# Patient Record
Sex: Female | Born: 1955
Health system: Southern US, Community
[De-identification: ages and names within clinical notes are randomized; demographics above are authoritative.]

## PROBLEM LIST (undated history)

## (undated) DIAGNOSIS — N289 Disorder of kidney and ureter, unspecified: Secondary | ICD-10-CM

## (undated) DIAGNOSIS — K219 Gastro-esophageal reflux disease without esophagitis: Secondary | ICD-10-CM

## (undated) DIAGNOSIS — T7840XA Allergy, unspecified, initial encounter: Secondary | ICD-10-CM

## (undated) DIAGNOSIS — M1990 Unspecified osteoarthritis, unspecified site: Secondary | ICD-10-CM

## (undated) DIAGNOSIS — H409 Unspecified glaucoma: Secondary | ICD-10-CM

## (undated) DIAGNOSIS — A419 Sepsis, unspecified organism: Secondary | ICD-10-CM

## (undated) HISTORY — PX: DILATION AND CURETTAGE, DIAGNOSTIC / THERAPEUTIC: SUR384

## (undated) HISTORY — DX: Allergy, unspecified, initial encounter: T78.40XA

## (undated) HISTORY — PX: EYE SURGERY: SHX253

## (undated) HISTORY — DX: Gastro-esophageal reflux disease without esophagitis: K21.9

## (undated) HISTORY — DX: Unspecified osteoarthritis, unspecified site: M19.90

## (undated) HISTORY — DX: Unspecified glaucoma: H40.9

---

## 2002-07-07 ENCOUNTER — Other Ambulatory Visit: Admission: RE | Admit: 2002-07-07 | Discharge: 2002-07-07 | Payer: Self-pay | Admitting: Obstetrics and Gynecology

## 2016-03-06 ENCOUNTER — Ambulatory Visit (INDEPENDENT_AMBULATORY_CARE_PROVIDER_SITE_OTHER): Payer: PRIVATE HEALTH INSURANCE | Admitting: Physician Assistant

## 2016-03-06 ENCOUNTER — Ambulatory Visit: Payer: PRIVATE HEALTH INSURANCE

## 2016-03-06 DIAGNOSIS — S61031A Puncture wound without foreign body of right thumb without damage to nail, initial encounter: Secondary | ICD-10-CM

## 2016-03-06 DIAGNOSIS — Z23 Encounter for immunization: Secondary | ICD-10-CM

## 2016-03-06 DIAGNOSIS — M79644 Pain in right finger(s): Secondary | ICD-10-CM

## 2016-03-06 MED ORDER — CEPHALEXIN 500 MG PO CAPS: 500.0000 mg | ORAL_CAPSULE | Freq: Three times a day (TID) | ORAL | Status: DC

## 2016-03-06 NOTE — Patient Instructions (Addendum)
Use warm compress and warm soaks when possible.  Take all of the antibiotic course.  Come back if you are worse, or the problem goes on for more than 72 hours from now.     IF you received an x-ray today, you will receive an invoice from [facility]. Please contact [facility] at [phone] with questions or concerns regarding your invoice.   IF you received labwork today, you will receive an invoice from [employer]. Please contact [facility] at [phone] with questions or concerns regarding your invoice.   Our billing staff will not be able to assist you with questions regarding bills from these companies.  You will be contacted with the lab results as soon as they are available. The fastest way to get your results is to activate your My Chart account. Instructions are located on the last page of this paperwork. If you have not heard from [place] regarding the results in 2 weeks, please contact this office.

## 2016-03-06 NOTE — Progress Notes (Signed)
   03/06/2016 5:05 PM   DOB: 1956-03-13 / MRN: [id number]  SUBJECTIVE:  Veronica Woods is a 60 y.o. female presenting for a penetrating injury to the right thumb.  States she was coming down the ladder from the attic and suddenly felt pain under the right thumb nail. She assumes there is a splinter under the nail.  The husband has tried to remove the splinter and this was very painful for her.  She report the finger feels warm to the touch.   She is allergic to sulfur.   She  has no past medical history on file.    She  reports that she has never smoked. She does not have any smokeless tobacco history on file. She reports that she does not drink alcohol or use illicit drugs. She  has no sexual activity history on file. The patient  has no past surgical history on file.  Her family history includes Cancer in her father and mother; Heart disease in her mother; Hyperlipidemia in her mother; Hypertension in her mother; Stroke in her mother.  Review of Systems  Skin: Negative for rash.  Neurological: Negative for dizziness.    Problem list and medications reviewed and updated by myself where necessary, and exist elsewhere in the encounter.   OBJECTIVE:  BP 118/80 mmHg  Pulse 98  Temp(Src) 98.2 F (36.8 C) (Oral)  Resp 18  Ht 5\' 4"  (1.626 m)  Wt 152 lb 6.4 oz (69.128 kg)  BMI 26.15 kg/m2  SpO2 96%  Physical Exam  Constitutional: She is oriented to person, place, and time. She appears well-nourished. No distress.  Eyes: EOM are normal. Pupils are equal, round, and reactive to light.  Cardiovascular: Normal rate.   Pulmonary/Chest: Effort normal.  Abdominal: She exhibits no distension.  Musculoskeletal:       Hands: Neurological: She is alert and oriented to person, place, and time. No cranial nerve deficit. Gait normal.  Skin: Skin is dry. She is not diaphoretic.  Psychiatric: She has a normal mood and affect.  Vitals reviewed.   No results found for this or any previous  visit.  No results found.  ASSESSMENT AND PLAN  Brittanny was seen today for foreign body in skin.  Diagnoses and all orders for this visit:  Thumb pain, right: Most likely secondary to trauma to the nail bed.  I do not think there is a splinter in her finger and worry that probing the finger with splinter forceps would do more harm than good at this point. Will prevent a cellulitis with Keflex 500 tid.  -     Cancel: DG Finger Thumb Right; Future  Need for TD vaccine -     Td vaccine greater than or equal to 7yo preservative free IM  Other orders -     cephALEXin (KEFLEX) 500 MG capsule; Take 1 capsule (500 mg total) by mouth 3 (three) times daily.    The patient was advised to call or return to clinic if she does not see an improvement in symptoms, or to seek the care of the closest emergency department if she worsens with the above plan.   Philis Fendt, MHS, PA-C [facility] 03/06/2016 5:05 PM

## 2016-08-31 DIAGNOSIS — I4819 Other persistent atrial fibrillation: Secondary | ICD-10-CM

## 2016-08-31 HISTORY — DX: Other persistent atrial fibrillation: I48.19

## 2017-05-11 ENCOUNTER — Ambulatory Visit (INDEPENDENT_AMBULATORY_CARE_PROVIDER_SITE_OTHER): Payer: PRIVATE HEALTH INSURANCE | Admitting: Emergency Medicine

## 2017-05-11 ENCOUNTER — Encounter: Payer: Self-pay | Admitting: Emergency Medicine

## 2017-05-11 VITALS — BP 142/90 | HR 108 | Temp 99.3°F | Resp 17 | Ht 64.5 in | Wt 152.0 lb

## 2017-05-11 DIAGNOSIS — R399 Unspecified symptoms and signs involving the genitourinary system: Secondary | ICD-10-CM | POA: Diagnosis not present

## 2017-05-11 DIAGNOSIS — R300 Dysuria: Secondary | ICD-10-CM | POA: Insufficient documentation

## 2017-05-11 DIAGNOSIS — B999 Unspecified infectious disease: Secondary | ICD-10-CM | POA: Insufficient documentation

## 2017-05-11 DIAGNOSIS — R102 Pelvic and perineal pain: Secondary | ICD-10-CM | POA: Insufficient documentation

## 2017-05-11 LAB — POC MICROSCOPIC URINALYSIS (UMFC): Mucus: ABSENT

## 2017-05-11 LAB — POCT URINALYSIS DIP (MANUAL ENTRY)
BILIRUBIN UA: NEGATIVE
Glucose, UA: NEGATIVE mg/dL
Ketones, POC UA: NEGATIVE mg/dL
Leukocytes, UA: NEGATIVE
NITRITE UA: NEGATIVE
Protein Ur, POC: NEGATIVE mg/dL
RBC UA: NEGATIVE
Spec Grav, UA: 1.01 (ref 1.010–1.025)
Urobilinogen, UA: 0.2 E.U./dL
pH, UA: 5.5 (ref 5.0–8.0)

## 2017-05-11 MED ORDER — CIPROFLOXACIN HCL 500 MG PO TABS: 500.0000 mg | ORAL_TABLET | Freq: Two times a day (BID) | ORAL | 0 refills | Status: AC

## 2017-05-11 NOTE — Patient Instructions (Addendum)
   IF you received an x-ray today, you will receive an invoice from [facility]. Please contact [facility] at [phone] with questions or concerns regarding your invoice.   IF you received labwork today, you will receive an invoice from [facility]. Please contact [facility] at [phone] with questions or concerns regarding your invoice.   Our billing staff will not be able to assist you with questions regarding bills from these companies.  You will be contacted with the lab results as soon as they are available. The fastest way to get your results is to activate your My Chart account. Instructions are located on the last page of this paperwork. If you have not heard from us regarding the results in 2 weeks, please contact this office.    Dysuria Dysuria is pain or discomfort while urinating. The pain or discomfort may be felt in the tube that carries urine out of the bladder (urethra) or in the surrounding tissue of the genitals. The pain may also be felt in the groin area, lower abdomen, and lower back. You may have to urinate frequently or have the sudden feeling that you have to urinate (urgency). Dysuria can affect both men and women, but is more common in women. Dysuria can be caused by many different things, including:  Urinary tract infection in women.  Infection of the kidney or bladder.  Kidney stones or bladder stones.  Certain sexually transmitted infections (STIs), such as chlamydia.  Dehydration.  Inflammation of the vagina.  Use of certain medicines.  Use of certain soaps or scented products that cause irritation.  Follow these instructions at home: Watch your dysuria for any changes. The following actions may help to reduce any discomfort you are feeling:  Drink enough fluid to keep your urine clear or pale yellow.  Empty your bladder often. Avoid holding urine for long periods of time.  After a bowel movement or urination, women should  cleanse from front to back, using each tissue only once.  Empty your bladder after sexual intercourse.  Take medicines only as directed by your health care provider.  If you were prescribed an antibiotic medicine, finish it all even if you start to feel better.  Avoid caffeine, tea, and alcohol. They can irritate the bladder and make dysuria worse. In men, alcohol may irritate the prostate.  Keep all follow-up visits as directed by your health care provider. This is important.  If you had any tests done to find the cause of dysuria, it is your responsibility to obtain your test results. Ask the lab or department performing the test when and how you will get your results. Talk with your health care provider if you have any questions about your results.  Contact a health care provider if:  You develop pain in your back or sides.  You have a fever.  You have nausea or vomiting.  You have blood in your urine.  You are not urinating as often as you usually do. Get help right away if:  You pain is severe and not relieved with medicines.  You are unable to hold down any fluids.  You or someone else notices a change in your mental function.  You have a rapid heartbeat at rest.  You have shaking or chills.  You feel extremely weak. This information is not intended to replace advice given to you by your health care provider. Make sure you discuss any questions you have with your health care provider. Document Released: 05/15/2004 Document   Revised: 01/23/2016 Document Reviewed: 04/12/2014 Elsevier Interactive Patient Education  2018 Elsevier Inc.  

## 2017-05-11 NOTE — Progress Notes (Signed)
Veronica Woods 61 y.o.   Chief Complaint  Patient presents with  . Dysuria    HISTORY OF PRESENT ILLNESS: This is a 61 y.o. female complaining of dark urine last week followed by urinary/pelvic discomfort and blood in the urine; denies flank pain but had some lumbar aching; denies fever, chills, nausea, and vomiting. Also noted some swelling to right foot and bilateral weakness to lower legs but that is now better/gone. Healthy woman with healthy lifestyle.  HPI   Prior to Admission medications   Not on File    Allergies  Allergen Reactions  . Sulfur Anaphylaxis    Childhood reaction    There are no active problems to display for this patient.   No past medical history on file.  No past surgical history on file.  Social History   Social History  . Marital status: Married    Spouse name: N/A  . Number of children: N/A  . Years of education: N/A   Occupational History  . Not on file.   Social History Main Topics  . Smoking status: Never Smoker  . Smokeless tobacco: Never Used  . Alcohol use No  . Drug use: No  . Sexual activity: Not on file   Other Topics Concern  . Not on file   Social History Narrative  . No narrative on file    Family History  Problem Relation Age of Onset  . Cancer Mother   . Heart disease Mother   . Hyperlipidemia Mother   . Hypertension Mother   . Stroke Mother   . Cancer Father      Review of Systems  Constitutional: Negative.  Negative for chills, fever and malaise/fatigue.  HENT: Negative.   Eyes: Negative.   Respiratory: Negative for cough and shortness of breath.   Cardiovascular: Positive for leg swelling. Negative for chest pain and palpitations.  Gastrointestinal: Negative for abdominal pain, blood in stool, diarrhea, melena, nausea and vomiting.  Genitourinary: Positive for dysuria, frequency and hematuria.  Musculoskeletal: Positive for back pain.  Skin: Negative.  Negative for rash.  Neurological: Negative for  dizziness, sensory change, focal weakness and headaches.  Endo/Heme/Allergies: Negative.   All other systems reviewed and are negative.  Vitals:   05/11/17 1114  BP: (!) 142/90  Pulse: (!) 108  Resp: 17  Temp: 99.3 F (37.4 C)  SpO2: 98%     Physical Exam  Constitutional: She is oriented to person, place, and time. She appears well-developed and well-nourished.  HENT:  Head: Normocephalic and atraumatic.  Nose: Nose normal.  Mouth/Throat: Oropharynx is clear and moist.  Eyes: Pupils are equal, round, and reactive to light. Conjunctivae and EOM are normal.  Neck: Normal range of motion. Neck supple. No JVD present. No thyromegaly present.  Cardiovascular: Normal rate, regular rhythm and normal heart sounds.   Pulmonary/Chest: Effort normal and breath sounds normal.  Abdominal: Soft. Bowel sounds are normal. She exhibits no distension and no mass. There is no tenderness. There is no rebound and no guarding.  Musculoskeletal: Normal range of motion. She exhibits no edema, tenderness or deformity.       Lumbar back: Normal.  Lymphadenopathy:    She has no cervical adenopathy.  Neurological: She is alert and oriented to person, place, and time. No sensory deficit. She exhibits normal muscle tone.  Skin: Skin is warm and dry. Capillary refill takes less than 2 seconds.  Psychiatric: She has a normal mood and affect. Her behavior is normal.  Vitals  reviewed.  Results for orders placed or performed in visit on 05/11/17 (from the past 24 hour(s))  POCT urinalysis dipstick     Status: None   Collection Time: 05/11/17 11:28 AM  Result Value Ref Range   Color, UA yellow yellow   Clarity, UA clear clear   Glucose, UA negative negative mg/dL   Bilirubin, UA negative negative   Ketones, POC UA negative negative mg/dL   Spec Grav, UA 1.010 1.010 - 1.025   Blood, UA negative negative   pH, UA 5.5 5.0 - 8.0   Protein Ur, POC negative negative mg/dL   Urobilinogen, UA 0.2 0.2 or 1.0  E.U./dL   Nitrite, UA Negative Negative   Leukocytes, UA Negative Negative  POCT Microscopic Urinalysis ([facility])     Status: Abnormal   Collection Time: 05/11/17 11:41 AM  Result Value Ref Range   WBC,UR,HPF,POC None None WBC/hpf   RBC,UR,HPF,POC None None RBC/hpf   Bacteria None None, Too numerous to count   Mucus Absent Absent   Epithelial Cells, UR Per Microscopy Few (A) None, Too numerous to count cells/hpf     ASSESSMENT & PLAN: Veronica Woods was seen today for dysuria.  Diagnoses and all orders for this visit:  Dysuria -     POCT urinalysis dipstick -     POCT Microscopic Urinalysis ([facility]) -     Urine Culture  Lower urinary tract symptoms (LUTS) -     CBC with Differential/Platelet -     Comprehensive metabolic panel  Clinical infection  Pelvic pain  Other orders -     ciprofloxacin (CIPRO) 500 MG tablet; Take 1 tablet (500 mg total) by mouth 2 (two) times daily.   Patient Instructions       IF you received an x-ray today, you will receive an invoice from [facility]. Please contact [facility] at [phone] with questions or concerns regarding your invoice.   IF you received labwork today, you will receive an invoice from [facility]. Please contact [facility] at [phone] with questions or concerns regarding your invoice.   Our billing staff will not be able to assist you with questions regarding bills from these companies.  You will be contacted with the lab results as soon as they are available. The fastest way to get your results is to activate your My Chart account. Instructions are located on the last page of this paperwork. If you have not heard from [place] regarding the results in 2 weeks, please contact this office.     Dysuria Dysuria is pain or discomfort while urinating. The pain or discomfort may be felt in the tube that carries urine out of the bladder (urethra) or in the surrounding tissue of the genitals. The pain may also be felt in the  groin area, lower abdomen, and lower back. You may have to urinate frequently or have the sudden feeling that you have to urinate (urgency). Dysuria can affect both men and women, but is more common in women. Dysuria can be caused by many different things, including:  Urinary tract infection in women.  Infection of the kidney or bladder.  Kidney stones or bladder stones.  Certain sexually transmitted infections (STIs), such as chlamydia.  Dehydration.  Inflammation of the vagina.  Use of certain medicines.  Use of certain soaps or scented products that cause irritation.  Follow these instructions at home: Watch your dysuria for any changes. The following actions may help to reduce any discomfort you are feeling:  Drink enough fluid to  keep your urine clear or pale yellow.  Empty your bladder often. Avoid holding urine for long periods of time.  After a bowel movement or urination, women should cleanse from front to back, using each tissue only once.  Empty your bladder after sexual intercourse.  Take medicines only as directed by your health care provider.  If you were prescribed an antibiotic medicine, finish it all even if you start to feel better.  Avoid caffeine, tea, and alcohol. They can irritate the bladder and make dysuria worse. In men, alcohol may irritate the prostate.  Keep all follow-up visits as directed by your health care provider. This is important.  If you had any tests done to find the cause of dysuria, it is your responsibility to obtain your test results. Ask the lab or department performing the test when and how you will get your results. Talk with your health care provider if you have any questions about your results.  Contact a health care provider if:  You develop pain in your back or sides.  You have a fever.  You have nausea or vomiting.  You have blood in your urine.  You are not urinating as often as you usually do. Get help right away  if:  You pain is severe and not relieved with medicines.  You are unable to hold down any fluids.  You or someone else notices a change in your mental function.  You have a rapid heartbeat at rest.  You have shaking or chills.  You feel extremely weak. This information is not intended to replace advice given to you by your health care provider. Make sure you discuss any questions you have with your health care provider. Document Released: 05/15/2004 Document Revised: 01/23/2016 Document Reviewed: 04/12/2014 Elsevier Interactive Patient Education  2018 Elsevier Inc.      Agustina Caroli, MD [facility]

## 2017-05-12 ENCOUNTER — Encounter: Payer: Self-pay | Admitting: Radiology

## 2017-05-12 LAB — CBC WITH DIFFERENTIAL/PLATELET
Basophils Absolute: 0 10*3/uL (ref 0.0–0.2)
Basos: 1 %
EOS (ABSOLUTE): 0 10*3/uL (ref 0.0–0.4)
Eos: 1 %
HEMATOCRIT: 41.7 % (ref 34.0–46.6)
HEMOGLOBIN: 14.1 g/dL (ref 11.1–15.9)
IMMATURE GRANS (ABS): 0 10*3/uL (ref 0.0–0.1)
IMMATURE GRANULOCYTES: 0 %
LYMPHS ABS: 0.8 10*3/uL (ref 0.7–3.1)
Lymphs: 19 %
MCH: 30.7 pg (ref 26.6–33.0)
MCHC: 33.8 g/dL (ref 31.5–35.7)
MCV: 91 fL (ref 79–97)
Monocytes Absolute: 0.4 10*3/uL (ref 0.1–0.9)
Monocytes: 8 %
Neutrophils Absolute: 3.1 10*3/uL (ref 1.4–7.0)
Neutrophils: 71 %
Platelets: 279 10*3/uL (ref 150–379)
RBC: 4.6 x10E6/uL (ref 3.77–5.28)
RDW: 14.4 % (ref 12.3–15.4)
WBC: 4.3 10*3/uL (ref 3.4–10.8)

## 2017-05-12 LAB — COMPREHENSIVE METABOLIC PANEL
A/G RATIO: 1.8 (ref 1.2–2.2)
ALT: 22 IU/L (ref 0–32)
AST: 26 IU/L (ref 0–40)
Albumin: 4.6 g/dL (ref 3.6–4.8)
Alkaline Phosphatase: 88 IU/L (ref 39–117)
BUN/Creatinine Ratio: 24 (ref 12–28)
BUN: 14 mg/dL (ref 8–27)
Bilirubin Total: 0.5 mg/dL (ref 0.0–1.2)
CALCIUM: 9.6 mg/dL (ref 8.7–10.3)
CO2: 24 mmol/L (ref 20–29)
Chloride: 101 mmol/L (ref 96–106)
Creatinine, Ser: 0.59 mg/dL (ref 0.57–1.00)
GFR, EST AFRICAN AMERICAN: 114 mL/min/{1.73_m2} (ref >59)
GFR, EST NON AFRICAN AMERICAN: 99 mL/min/{1.73_m2} (ref >59)
GLOBULIN, TOTAL: 2.6 g/dL (ref 1.5–4.5)
Glucose: 84 mg/dL (ref 65–99)
POTASSIUM: 4.7 mmol/L (ref 3.5–5.2)
SODIUM: 139 mmol/L (ref 134–144)
Total Protein: 7.2 g/dL (ref 6.0–8.5)

## 2017-05-13 LAB — URINE CULTURE

## 2017-05-24 ENCOUNTER — Telehealth: Payer: Self-pay | Admitting: Emergency Medicine

## 2017-05-24 NOTE — Telephone Encounter (Signed)
Patient will call and get an appointment with Dr. Rosezella Rumpf tomorrow for a recheck of her symptoms

## 2017-05-24 NOTE — Telephone Encounter (Signed)
Pt is stating that she saw sagardia recently for a uti and has completed the meds on 05/18/17 and the symptoms have returned and would like to know what to do next  Best number [phone]

## 2017-05-25 ENCOUNTER — Ambulatory Visit (INDEPENDENT_AMBULATORY_CARE_PROVIDER_SITE_OTHER): Payer: PRIVATE HEALTH INSURANCE | Admitting: Emergency Medicine

## 2017-05-25 ENCOUNTER — Encounter: Payer: Self-pay | Admitting: Emergency Medicine

## 2017-05-25 VITALS — BP 122/68 | HR 107 | Temp 99.1°F | Resp 16 | Ht 64.0 in | Wt 151.2 lb

## 2017-05-25 DIAGNOSIS — R319 Hematuria, unspecified: Secondary | ICD-10-CM | POA: Insufficient documentation

## 2017-05-25 DIAGNOSIS — N390 Urinary tract infection, site not specified: Secondary | ICD-10-CM | POA: Insufficient documentation

## 2017-05-25 DIAGNOSIS — R399 Unspecified symptoms and signs involving the genitourinary system: Secondary | ICD-10-CM

## 2017-05-25 LAB — POCT URINALYSIS DIP (MANUAL ENTRY)
BILIRUBIN UA: NEGATIVE
GLUCOSE UA: NEGATIVE mg/dL
Ketones, POC UA: NEGATIVE mg/dL
Leukocytes, UA: NEGATIVE
NITRITE UA: NEGATIVE
PH UA: 6.5 (ref 5.0–8.0)
Protein Ur, POC: NEGATIVE mg/dL
SPEC GRAV UA: 1.01 (ref 1.010–1.025)
Urobilinogen, UA: 0.2 E.U./dL

## 2017-05-25 MED ORDER — CEFADROXIL 500 MG PO CAPS: 500.0000 mg | ORAL_CAPSULE | Freq: Two times a day (BID) | ORAL | 0 refills | Status: AC

## 2017-05-25 NOTE — Patient Instructions (Addendum)
     IF you received an x-ray today, you will receive an invoice from [facility]. Please contact [facility] at [phone] with questions or concerns regarding your invoice.   IF you received labwork today, you will receive an invoice from [facility]. Please contact [facility] at [phone] with questions or concerns regarding your invoice.   Our billing staff will not be able to assist you with questions regarding bills from these companies.  You will be contacted with the lab results as soon as they are available. The fastest way to get your results is to activate your My Chart account. Instructions are located on the last page of this paperwork. If you have not heard from us regarding the results in 2 weeks, please contact this office.     Urinary Tract Infection, Adult A urinary tract infection (UTI) is an infection of any part of the urinary tract. The urinary tract includes the:  Kidneys.  Ureters.  Bladder.  Urethra.  These organs make, store, and get rid of pee (urine) in the body. Follow these instructions at home:  Take over-the-counter and prescription medicines only as told by your doctor.  If you were prescribed an antibiotic medicine, take it as told by your doctor. Do not stop taking the antibiotic even if you start to feel better.  Avoid the following drinks: ? Alcohol. ? Caffeine. ? Tea. ? Carbonated drinks.  Drink enough fluid to keep your pee clear or pale yellow.  Keep all follow-up visits as told by your doctor. This is important.  Make sure to: ? Empty your bladder often and completely. Do not to hold pee for long periods of time. ? Empty your bladder before and after sex. ? Wipe from front to back after a bowel movement if you are female. Use each tissue one time when you wipe. Contact a doctor if:  You have back pain.  You have a fever.  You feel sick to your stomach (nauseous).  You throw up (vomit).  Your symptoms do  not get better after 3 days.  Your symptoms go away and then come back. Get help right away if:  You have very bad back pain.  You have very bad lower belly (abdominal) pain.  You are throwing up and cannot keep down any medicines or water. This information is not intended to replace advice given to you by your health care provider. Make sure you discuss any questions you have with your health care provider. Document Released: 02/03/2008 Document Revised: 01/23/2016 Document Reviewed: 07/08/2015 Elsevier Interactive Patient Education  2018 Elsevier Inc.  

## 2017-05-25 NOTE — Progress Notes (Signed)
Veronica Woods 61 y.o.   Chief Complaint  Patient presents with  . Urinary Tract Infection    per patient recurrent with blood in urine and left side pain    HISTORY OF PRESENT ILLNESS: This is a 61 y.o. female complaining of UTI symptoms; seen by me 9/11 and started on Cipro; did well bu 2 days after stopping Cipro symptoms came back. Urine culture grew Streptococcus Group B.  Urinary Tract Infection   This is a new problem. The current episode started in the past 7 days. The problem occurs every urination. The problem has been gradually worsening. The quality of the pain is described as burning. The pain is at a severity of 3/10. The pain is mild. The maximum temperature recorded prior to her arrival was 100 - 100.9 F. The fever has been present for 1 - 2 days. There is no history of pyelonephritis. Associated symptoms include flank pain, frequency, hematuria and urgency. Pertinent negatives include no chills, discharge, nausea or vomiting. She has tried antibiotics (recently on Cipro) for the symptoms. There is no history of catheterization, kidney stones or a urological procedure.     Prior to Admission medications   Not on File    Allergies  Allergen Reactions  . Sulfur Anaphylaxis    Childhood reaction    Patient Active Problem List   Diagnosis Date Noted  . Pelvic pain 05/11/2017  . Clinical infection 05/11/2017  . Lower urinary tract symptoms (LUTS) 05/11/2017  . Dysuria 05/11/2017    No past medical history on file.  No past surgical history on file.  Social History   Social History  . Marital status: Married    Spouse name: N/A  . Number of children: N/A  . Years of education: N/A   Occupational History  . Not on file.   Social History Main Topics  . Smoking status: Never Smoker  . Smokeless tobacco: Never Used  . Alcohol use No  . Drug use: No  . Sexual activity: Not on file   Other Topics Concern  . Not on file   Social History Narrative  . No  narrative on file    Family History  Problem Relation Age of Onset  . Cancer Mother   . Heart disease Mother   . Hyperlipidemia Mother   . Hypertension Mother   . Stroke Mother   . Cancer Father      Review of Systems  Constitutional: Positive for fever. Negative for chills.  HENT: Negative.  Negative for sore throat.   Eyes: Negative.  Negative for discharge and redness.  Respiratory: Negative.  Negative for cough and shortness of breath.   Cardiovascular: Negative.  Negative for chest pain, palpitations and leg swelling.  Gastrointestinal: Negative for abdominal pain, diarrhea, nausea and vomiting.  Genitourinary: Positive for dysuria, flank pain, frequency, hematuria and urgency.  Musculoskeletal: Negative for joint pain, myalgias and neck pain.  Skin: Negative.  Negative for rash.  Neurological: Negative.  Negative for dizziness and headaches.  Endo/Heme/Allergies: Negative.   All other systems reviewed and are negative.  Vitals:   05/25/17 0953  BP: 122/68  Pulse: (!) 107  Resp: 16  Temp: 99.1 F (37.3 C)  SpO2: 98%   Results for orders placed or performed in visit on 05/25/17 (from the past 24 hour(s))  POCT urinalysis dipstick     Status: Abnormal   Collection Time: 05/25/17 10:35 AM  Result Value Ref Range   Color, UA yellow yellow  Clarity, UA clear clear   Glucose, UA negative negative mg/dL   Bilirubin, UA negative negative   Ketones, POC UA negative negative mg/dL   Spec Grav, UA 1.010 1.010 - 1.025   Blood, UA small (A) negative   pH, UA 6.5 5.0 - 8.0   Protein Ur, POC negative negative mg/dL   Urobilinogen, UA 0.2 0.2 or 1.0 E.U./dL   Nitrite, UA Negative Negative   Leukocytes, UA Negative Negative     Physical Exam  Constitutional: She is oriented to person, place, and time. She appears well-developed and well-nourished.  HENT:  Head: Normocephalic and atraumatic.  Eyes: Pupils are equal, round, and reactive to light. Conjunctivae and EOM  are normal.  Neck: Normal range of motion. Neck supple.  Cardiovascular: Normal rate, regular rhythm, normal heart sounds and intact distal pulses.   Pulmonary/Chest: Effort normal and breath sounds normal.  Abdominal: Soft. Bowel sounds are normal. She exhibits no distension. There is no tenderness.  Musculoskeletal: Normal range of motion.  Neurological: She is alert and oriented to person, place, and time. No sensory deficit. She exhibits normal muscle tone.  Skin: Skin is warm and dry. Capillary refill takes less than 2 seconds. No rash noted.  Psychiatric: She has a normal mood and affect. Her behavior is normal.  Vitals reviewed.    ASSESSMENT & PLAN: Taffie was seen today for urinary tract infection.  Diagnoses and all orders for this visit:  Lower urinary tract symptoms (LUTS) -     Urine Culture -     POCT urinalysis dipstick  Hematuria, unspecified type  Acute UTI  Other orders -     cefadroxil (DURICEF) 500 MG capsule; Take 1 capsule (500 mg total) by mouth 2 (two) times daily.    Patient Instructions       IF you received an x-ray today, you will receive an invoice from [facility]. Please contact [facility] at [phone] with questions or concerns regarding your invoice.   IF you received labwork today, you will receive an invoice from [facility]. Please contact [facility] at [phone] with questions or concerns regarding your invoice.   Our billing staff will not be able to assist you with questions regarding bills from these companies.  You will be contacted with the lab results as soon as they are available. The fastest way to get your results is to activate your My Chart account. Instructions are located on the last page of this paperwork. If you have not heard from [place] regarding the results in 2 weeks, please contact this office.     Urinary Tract Infection, Adult A urinary tract infection (UTI) is an infection of any part of the  urinary tract. The urinary tract includes the:  Kidneys.  Ureters.  Bladder.  Urethra.  These organs make, store, and get rid of pee (urine) in the body. Follow these instructions at home:  Take over-the-counter and prescription medicines only as told by your doctor.  If you were prescribed an antibiotic medicine, take it as told by your doctor. Do not stop taking the antibiotic even if you start to feel better.  Avoid the following drinks: ? Alcohol. ? Caffeine. ? Tea. ? Carbonated drinks.  Drink enough fluid to keep your pee clear or pale yellow.  Keep all follow-up visits as told by your doctor. This is important.  Make sure to: ? Empty your bladder often and completely. Do not to hold pee for long periods of time. ? Empty your bladder  before and after sex. ? Wipe from front to back after a bowel movement if you are female. Use each tissue one time when you wipe. Contact a doctor if:  You have back pain.  You have a fever.  You feel sick to your stomach (nauseous).  You throw up (vomit).  Your symptoms do not get better after 3 days.  Your symptoms go away and then come back. Get help right away if:  You have very bad back pain.  You have very bad lower belly (abdominal) pain.  You are throwing up and cannot keep down any medicines or water. This information is not intended to replace advice given to you by your health care provider. Make sure you discuss any questions you have with your health care provider. Document Released: 02/03/2008 Document Revised: 01/23/2016 Document Reviewed: 07/08/2015 Elsevier Interactive Patient Education  2018 Elsevier Inc.      Agustina Caroli, MD [facility]

## 2017-05-27 LAB — URINE CULTURE

## 2017-05-31 NOTE — Telephone Encounter (Signed)
Informed patient of lab results

## 2017-06-04 ENCOUNTER — Ambulatory Visit (HOSPITAL_COMMUNITY)
Admission: RE | Admit: 2017-06-04 | Discharge: 2017-06-04 | Disposition: A | Discharge status: Home / Self Care | Payer: BC Managed Care – PPO | Source: Ambulatory Visit | Attending: Emergency Medicine | Admitting: Emergency Medicine

## 2017-06-04 ENCOUNTER — Ambulatory Visit (INDEPENDENT_AMBULATORY_CARE_PROVIDER_SITE_OTHER): Payer: PRIVATE HEALTH INSURANCE | Admitting: Emergency Medicine

## 2017-06-04 ENCOUNTER — Encounter: Payer: Self-pay | Admitting: Emergency Medicine

## 2017-06-04 VITALS — BP 104/66 | HR 115 | Temp 98.6°F | Resp 16 | Ht 64.0 in | Wt 151.4 lb

## 2017-06-04 DIAGNOSIS — R399 Unspecified symptoms and signs involving the genitourinary system: Secondary | ICD-10-CM | POA: Diagnosis not present

## 2017-06-04 DIAGNOSIS — R1032 Left lower quadrant pain: Secondary | ICD-10-CM

## 2017-06-04 DIAGNOSIS — R319 Hematuria, unspecified: Secondary | ICD-10-CM | POA: Diagnosis not present

## 2017-06-04 DIAGNOSIS — R109 Unspecified abdominal pain: Secondary | ICD-10-CM | POA: Insufficient documentation

## 2017-06-04 DIAGNOSIS — N200 Calculus of kidney: Secondary | ICD-10-CM | POA: Insufficient documentation

## 2017-06-04 DIAGNOSIS — K449 Diaphragmatic hernia without obstruction or gangrene: Secondary | ICD-10-CM | POA: Diagnosis not present

## 2017-06-04 LAB — POCT URINALYSIS DIP (MANUAL ENTRY)
BILIRUBIN UA: NEGATIVE
BILIRUBIN UA: NEGATIVE mg/dL
Glucose, UA: NEGATIVE mg/dL
Leukocytes, UA: NEGATIVE
NITRITE UA: NEGATIVE
PH UA: 5.5 (ref 5.0–8.0)
Spec Grav, UA: >=1.03 — AB (ref 1.010–1.025)
Urobilinogen, UA: 0.2 E.U./dL

## 2017-06-04 MED ORDER — AMOXICILLIN-POT CLAVULANATE 875-125 MG PO TABS: 1.0000 | ORAL_TABLET | Freq: Two times a day (BID) | ORAL | 0 refills | Status: AC

## 2017-06-04 NOTE — Progress Notes (Signed)
Veronica Woods 61 y.o.   Chief Complaint  Patient presents with  . Urinary Tract Infection    with urinary frequency and pain - ALL SXs x 1 month  . Hematuria    HISTORY OF PRESENT ILLNESS: This is a 61 y.o. female complaining of persistent UTI symptoms since 05/11/17; urine culture x2 grew strep group B; treated with Cipro x 7 days with partial relief; then Duricef x 7 days with partial relief; the last few days had episodes of sharp pain to left flank radiating down the groin with blood in the urine suspicious for kidney stones.   HPI   Prior to Admission medications   Not on File    Allergies  Allergen Reactions  . Sulfur Anaphylaxis    Childhood reaction    Patient Active Problem List   Diagnosis Date Noted  . Hematuria 05/25/2017  . Acute UTI 05/25/2017  . Pelvic pain 05/11/2017  . Clinical infection 05/11/2017  . Lower urinary tract symptoms (LUTS) 05/11/2017  . Dysuria 05/11/2017    No past medical history on file.  No past surgical history on file.  Social History   Social History  . Marital status: Married    Spouse name: N/A  . Number of children: N/A  . Years of education: N/A   Occupational History  . Not on file.   Social History Main Topics  . Smoking status: Never Smoker  . Smokeless tobacco: Never Used  . Alcohol use No  . Drug use: No  . Sexual activity: Not on file   Other Topics Concern  . Not on file   Social History Narrative  . No narrative on file    Family History  Problem Relation Age of Onset  . Cancer Mother   . Heart disease Mother   . Hyperlipidemia Mother   . Hypertension Mother   . Stroke Mother   . Cancer Father      Review of Systems  Constitutional: Negative for chills and fever.  Respiratory: Negative.  Negative for cough and shortness of breath.   Cardiovascular: Negative.  Negative for chest pain and leg swelling.  Gastrointestinal: Negative for abdominal pain, blood in stool, diarrhea, melena, nausea  and vomiting.  Genitourinary: Positive for dysuria, flank pain, frequency and hematuria.  Musculoskeletal: Negative for back pain, myalgias and neck pain.  Skin: Negative for rash.  Neurological: Negative for dizziness and headaches.  Endo/Heme/Allergies: Negative.   All other systems reviewed and are negative.  Vitals:   06/04/17 0827  BP: 104/66  Pulse: (!) 115  Resp: 16  Temp: 98.6 F (37 C)  SpO2: 99%     Physical Exam  Constitutional: She is oriented to person, place, and time. She appears well-developed and well-nourished.  HENT:  Head: Normocephalic and atraumatic.  Mouth/Throat: Oropharynx is clear and moist.  Eyes: Pupils are equal, round, and reactive to light. Conjunctivae and EOM are normal.  Neck: Normal range of motion. Neck supple. No JVD present. No thyromegaly present.  Cardiovascular: Normal rate, regular rhythm, normal heart sounds and intact distal pulses.   Repeat HR 90 and regular.  Pulmonary/Chest: Effort normal and breath sounds normal.  Abdominal: Soft. Bowel sounds are normal. She exhibits no distension. There is no tenderness.  Musculoskeletal: Normal range of motion.  Lymphadenopathy:    She has no cervical adenopathy.  Neurological: She is alert and oriented to person, place, and time. No sensory deficit. She exhibits normal muscle tone.  Skin: Skin is warm and  dry. Capillary refill takes less than 2 seconds.  Psychiatric: She has a normal mood and affect. Her behavior is normal.  Vitals reviewed.    Results for orders placed or performed in visit on 06/04/17 (from the past 24 hour(s))  POCT urinalysis dipstick     Status: Abnormal   Collection Time: 06/04/17  9:00 AM  Result Value Ref Range   Color, UA yellow yellow   Clarity, UA clear clear   Glucose, UA negative negative mg/dL   Bilirubin, UA negative negative   Ketones, POC UA negative negative mg/dL   Spec Grav, UA >=1.030 (A) 1.010 - 1.025   Blood, UA moderate (A) negative   pH, UA  5.5 5.0 - 8.0   Protein Ur, POC =30 (A) negative mg/dL   Urobilinogen, UA 0.2 0.2 or 1.0 E.U./dL   Nitrite, UA Negative Negative   Leukocytes, UA Negative Negative    Ct Abdomen Pelvis Wo Contrast  Result Date: 06/04/2017 CLINICAL DATA:  Left flank pain and hematuria for several days. EXAM: CT ABDOMEN AND PELVIS WITHOUT CONTRAST TECHNIQUE: Multidetector CT imaging of the abdomen and pelvis was performed following the standard protocol without IV contrast. COMPARISON:  None. FINDINGS: Lower chest: 6 mm left lower lobe pulmonary nodule. Patchy density at the posterior left lung base on image 23. Hepatobiliary: Unremarkable Pancreas: Unremarkable Spleen: Unremarkable Adrenals/Urinary Tract: There are no ureteral calculi. There is no hydronephrosis. Renal pelvises are mildly prominent bilaterally. There are no right renal calculi. There is a tiny calculus in the lower pole of the left kidney. There is also a 8 mm calculus in the dependent portion of the left renal pelvis. Urothelium of the left renal pelvis is thickened. Bladder is decompressed. Stomach/Bowel: Moderate-sized hiatal hernia. No obvious mass in the colon. Minimal diverticulosis of the sigmoid colon. Appendix is decompressed. There is an appendicolith at the tip of the appendix. There is no evidence of acute appendicitis. Vascular/Lymphatic: No abnormal retroperitoneal adenopathy. No evidence of aortic aneurysm Reproductive: Uterus and adnexa are within normal limits. Other: No free-fluid. Musculoskeletal: No vertebral compression deformity. There is moderate narrowing of the L4-5 and L5-S1 discs. There is vacuum phenomenon at the L5-S1 disc. IMPRESSION: There is an 8 mm calculus layering in the left renal pelvis associated with urothelial thickening. The calculus may intermittently obstruct at the ureteropelvic junction or cause an inflammatory process along the adjacent urothelium. Tiny calculus in the lower pole of the left kidney. No definite  ureteral calculus.  No overt hydronephrosis. Hiatal hernia. Electronically Signed   By: Marybelle Killings M.D.   On: 06/04/2017 11:04    ASSESSMENT & PLAN: Veronica Woods was seen today for urinary tract infection and hematuria.  Diagnoses and all orders for this visit:  Flank pain Comments: suspected kidney stones  Hematuria, unspecified type  Lower urinary tract symptoms (LUTS) -     Urine Culture -     POCT urinalysis dipstick  Left lower quadrant pain -     CT Abdomen Pelvis Wo Contrast; Future  Kidney stone -     Ambulatory referral to Urology  Other orders -     amoxicillin-clavulanate (AUGMENTIN) 875-125 MG tablet; Take 1 tablet by mouth 2 (two) times daily.   Patient Instructions   Go to [facility] Main Entrance and you will be directed to department for CT scan.      We recommend that you schedule a mammogram for breast cancer screening. Typically, you do not need a referral to do this.  Please contact a local imaging center to schedule your mammogram.  [facility] - [phone]  *ask for the Radiology Department [facility] - [phone] or [phone]  [facility] - [phone] [place] [phone] [facility] - [phone]  *ask for the [facility] - [phone]  *ask for the Radiology Department [facility] - [phone]  *ask for the [place] - [phone]    IF you received an x-ray today, you will receive an invoice from [facility] Radiology. Please contact [facility] Radiology at [phone] with questions or concerns regarding your invoice.   IF you received labwork today, you will receive an invoice from [facility]. Please contact [facility] at [phone] with questions or concerns regarding your invoice.   Our billing staff will not be able to assist you with questions  regarding bills from these companies.  You will be contacted with the lab results as soon as they are available. The fastest way to get your results is to activate your My Chart account. Instructions are located on the last page of this paperwork. If you have not heard from [place] regarding the results in 2 weeks, please contact this office.    Flank Pain Flank pain is pain in your side. The flank is the area of your side between your upper belly (abdomen) and your back. The pain may occur over a short period of time (acute) or may be long-term or come back often (chronic). It may be mild or very bad. Pain in this area can be caused by many different things. Follow these instructions at home:  Rest as told by your doctor.  Drink enough fluid to keep your pee (urine) clear or pale yellow.  Take over-the-counter and prescription medicines only as told by your doctor.  Keep all follow-up visits as told by your doctor. This is important. Contact a doctor if:  Medicine does not help your pain.  You have new symptoms.  Your pain gets worse.  You have a fever.  Your symptoms last longer than 2-3 days. Get help right away if:  Your tummy hurts or is swollen.  You are short of breath.  You feel sick to your stomach (nauseous) and it does not go away.  You cannot stop throwing up (vomiting).  You feel like you will pass out or you do pass out (faint).  You have blood in your pee.  You have a fever and your symptoms suddenly get worse. This information is not intended to replace advice given to you by your health care provider. Make sure you discuss any questions you have with your health care provider. Document Released: 05/26/2008 Document Revised: 05/08/2016 Document Reviewed: 05/21/2015 Elsevier Interactive Patient Education  2018 Elsevier Inc.      Agustina Caroli, MD [facility]

## 2017-06-04 NOTE — Patient Instructions (Addendum)
Go to [facility] Main Entrance and you will be directed to department for CT scan.      We recommend that you schedule a mammogram for breast cancer screening. Typically, you do not need a referral to do this. Please contact a local imaging center to schedule your mammogram.  [facility] - [phone]  *ask for the Radiology Department [facility] [place] [facility] - [phone] or [phone]  [facility] - [phone] [place] [phone] [facility] - [phone]  *ask for the [facility] - [phone]  *ask for the Radiology Department [facility] - [phone]  *ask for the [facility] - [phone]    IF you received an x-ray today, you will receive an invoice from [facility] Radiology. Please contact [facility] Radiology at [phone] with questions or concerns regarding your invoice.   IF you received labwork today, you will receive an invoice from [facility]. Please contact [facility] at [phone] with questions or concerns regarding your invoice.   Our billing staff will not be able to assist you with questions regarding bills from these companies.  You will be contacted with the lab results as soon as they are available. The fastest way to get your results is to activate your My Chart account. Instructions are located on the last page of this paperwork. If you have not heard from [place] regarding the results in 2 weeks, please contact this office.    Flank Pain Flank pain is pain in your side. The flank is the area of your side between your upper belly (abdomen) and your back. The pain may occur over a short period of time (acute) or may be long-term or come back often (chronic). It may be mild or very bad. Pain in this area can be caused by many different things. Follow these instructions at home:  Rest as told by  your doctor.  Drink enough fluid to keep your pee (urine) clear or pale yellow.  Take over-the-counter and prescription medicines only as told by your doctor.  Keep all follow-up visits as told by your doctor. This is important. Contact a doctor if:  Medicine does not help your pain.  You have new symptoms.  Your pain gets worse.  You have a fever.  Your symptoms last longer than 2-3 days. Get help right away if:  Your tummy hurts or is swollen.  You are short of breath.  You feel sick to your stomach (nauseous) and it does not go away.  You cannot stop throwing up (vomiting).  You feel like you will pass out or you do pass out (faint).  You have blood in your pee.  You have a fever and your symptoms suddenly get worse. This information is not intended to replace advice given to you by your health care provider. Make sure you discuss any questions you have with your health care provider. Document Released: 05/26/2008 Document Revised: 05/08/2016 Document Reviewed: 05/21/2015 Elsevier Interactive Patient Education  2018 [employer].

## 2017-06-06 LAB — URINE CULTURE

## 2017-06-08 ENCOUNTER — Encounter: Payer: Self-pay | Admitting: Radiology

## 2017-06-25 ENCOUNTER — Inpatient Hospital Stay (HOSPITAL_COMMUNITY)
Admission: EM | Admit: 2017-06-25 | Discharge: 2017-06-28 | DRG: 871 | Disposition: A | Discharge status: Home / Self Care | Payer: BC Managed Care – PPO | Attending: Internal Medicine | Admitting: Internal Medicine

## 2017-06-25 ENCOUNTER — Emergency Department (HOSPITAL_COMMUNITY): Payer: BC Managed Care – PPO

## 2017-06-25 ENCOUNTER — Encounter (HOSPITAL_COMMUNITY): Payer: Self-pay | Admitting: Emergency Medicine

## 2017-06-25 DIAGNOSIS — D649 Anemia, unspecified: Secondary | ICD-10-CM | POA: Diagnosis present

## 2017-06-25 DIAGNOSIS — A419 Sepsis, unspecified organism: Principal | ICD-10-CM

## 2017-06-25 DIAGNOSIS — Z823 Family history of stroke: Secondary | ICD-10-CM

## 2017-06-25 DIAGNOSIS — E876 Hypokalemia: Secondary | ICD-10-CM | POA: Diagnosis present

## 2017-06-25 DIAGNOSIS — R011 Cardiac murmur, unspecified: Secondary | ICD-10-CM | POA: Diagnosis not present

## 2017-06-25 DIAGNOSIS — D696 Thrombocytopenia, unspecified: Secondary | ICD-10-CM | POA: Diagnosis present

## 2017-06-25 DIAGNOSIS — I480 Paroxysmal atrial fibrillation: Secondary | ICD-10-CM | POA: Diagnosis present

## 2017-06-25 DIAGNOSIS — N390 Urinary tract infection, site not specified: Secondary | ICD-10-CM | POA: Diagnosis present

## 2017-06-25 DIAGNOSIS — R6521 Severe sepsis with septic shock: Secondary | ICD-10-CM | POA: Diagnosis present

## 2017-06-25 DIAGNOSIS — I4891 Unspecified atrial fibrillation: Secondary | ICD-10-CM | POA: Insufficient documentation

## 2017-06-25 DIAGNOSIS — N179 Acute kidney failure, unspecified: Secondary | ICD-10-CM | POA: Diagnosis present

## 2017-06-25 DIAGNOSIS — Z8249 Family history of ischemic heart disease and other diseases of the circulatory system: Secondary | ICD-10-CM

## 2017-06-25 DIAGNOSIS — Z87442 Personal history of urinary calculi: Secondary | ICD-10-CM | POA: Diagnosis not present

## 2017-06-25 DIAGNOSIS — I248 Other forms of acute ischemic heart disease: Secondary | ICD-10-CM | POA: Diagnosis present

## 2017-06-25 DIAGNOSIS — Z79899 Other long term (current) drug therapy: Secondary | ICD-10-CM | POA: Diagnosis not present

## 2017-06-25 DIAGNOSIS — Z87440 Personal history of urinary (tract) infections: Secondary | ICD-10-CM

## 2017-06-25 LAB — COMPREHENSIVE METABOLIC PANEL
ALK PHOS: 150 U/L — AB (ref 38–126)
ALT: 28 U/L (ref 14–54)
ANION GAP: 17 — AB (ref 5–15)
AST: 43 U/L — ABNORMAL HIGH (ref 15–41)
Albumin: 3.3 g/dL — ABNORMAL LOW (ref 3.5–5.0)
BUN: 20 mg/dL (ref 6–20)
CALCIUM: 8.8 mg/dL — AB (ref 8.9–10.3)
CHLORIDE: 101 mmol/L (ref 101–111)
CO2: 18 mmol/L — AB (ref 22–32)
Creatinine, Ser: 1.37 mg/dL — ABNORMAL HIGH (ref 0.44–1.00)
GFR calc non Af Amer: 41 mL/min — ABNORMAL LOW (ref >60)
GFR, EST AFRICAN AMERICAN: 47 mL/min — AB (ref >60)
Glucose, Bld: 102 mg/dL — ABNORMAL HIGH (ref 65–99)
Potassium: 3.4 mmol/L — ABNORMAL LOW (ref 3.5–5.1)
SODIUM: 136 mmol/L (ref 135–145)
Total Bilirubin: 2.2 mg/dL — ABNORMAL HIGH (ref 0.3–1.2)
Total Protein: 6.8 g/dL (ref 6.5–8.1)

## 2017-06-25 LAB — URINALYSIS, ROUTINE W REFLEX MICROSCOPIC
Bilirubin Urine: NEGATIVE
Glucose, UA: NEGATIVE mg/dL
Ketones, ur: 20 mg/dL — AB
NITRITE: NEGATIVE
PH: 5 (ref 5.0–8.0)
Protein, ur: 100 mg/dL — AB
SPECIFIC GRAVITY, URINE: 1.015 (ref 1.005–1.030)

## 2017-06-25 LAB — LACTIC ACID, PLASMA
LACTIC ACID, VENOUS: 1.8 mmol/L (ref 0.5–1.9)
LACTIC ACID, VENOUS: 1.3 mmol/L (ref 0.5–1.9)

## 2017-06-25 LAB — CBC WITH DIFFERENTIAL/PLATELET
BASOS PCT: 0 %
Band Neutrophils: 0 %
Basophils Absolute: 0 10*3/uL (ref 0.0–0.1)
Blasts: 0 %
Eosinophils Absolute: 0 10*3/uL (ref 0.0–0.7)
Eosinophils Relative: 0 %
HEMATOCRIT: 39 % (ref 36.0–46.0)
HEMOGLOBIN: 13.7 g/dL (ref 12.0–15.0)
LYMPHS PCT: 6 %
Lymphs Abs: 0.9 10*3/uL (ref 0.7–4.0)
MCH: 31.1 pg (ref 26.0–34.0)
MCHC: 35.1 g/dL (ref 30.0–36.0)
MCV: 88.6 fL (ref 78.0–100.0)
MONO ABS: 0.5 10*3/uL (ref 0.1–1.0)
Metamyelocytes Relative: 0 %
Monocytes Relative: 3 %
Myelocytes: 0 %
NEUTROS PCT: 91 %
NRBC: 0 /100{WBCs}
Neutro Abs: 13.9 10*3/uL — ABNORMAL HIGH (ref 1.7–7.7)
OTHER: 0 %
PROMYELOCYTES ABS: 0 %
Platelets: 117 10*3/uL — ABNORMAL LOW (ref 150–400)
RBC: 4.4 MIL/uL (ref 3.87–5.11)
RDW: 12.8 % (ref 11.5–15.5)
WBC: 15.3 10*3/uL — ABNORMAL HIGH (ref 4.0–10.5)

## 2017-06-25 LAB — PROCALCITONIN: Procalcitonin: 109.03 ng/mL

## 2017-06-25 LAB — I-STAT CHEM 8, ED
BUN: 19 mg/dL (ref 6–20)
Calcium, Ion: 1.08 mmol/L — ABNORMAL LOW (ref 1.15–1.40)
Chloride: 102 mmol/L (ref 101–111)
Creatinine, Ser: 1.2 mg/dL — ABNORMAL HIGH (ref 0.44–1.00)
Glucose, Bld: 97 mg/dL (ref 65–99)
HEMATOCRIT: 39 % (ref 36.0–46.0)
HEMOGLOBIN: 13.3 g/dL (ref 12.0–15.0)
POTASSIUM: 3.5 mmol/L (ref 3.5–5.1)
SODIUM: 135 mmol/L (ref 135–145)
TCO2: 21 mmol/L — AB (ref 22–32)

## 2017-06-25 LAB — APTT: aPTT: 37 seconds — ABNORMAL HIGH (ref 24–36)

## 2017-06-25 LAB — PROTIME-INR
INR: 1.04
Prothrombin Time: 13.5 seconds (ref 11.4–15.2)

## 2017-06-25 LAB — I-STAT CG4 LACTIC ACID, ED
LACTIC ACID, VENOUS: 2.59 mmol/L — AB (ref 0.5–1.9)
Lactic Acid, Venous: 3.35 mmol/L (ref 0.5–1.9)

## 2017-06-25 LAB — I-STAT TROPONIN, ED: TROPONIN I, POC: 0.17 ng/mL — AB (ref 0.00–0.08)

## 2017-06-25 LAB — MRSA PCR SCREENING: MRSA BY PCR: NEGATIVE

## 2017-06-25 MED ORDER — DEXTROSE 5 % IV SOLN
2.0000 g | Freq: Once | INTRAVENOUS | Status: DC
  Filled 2017-06-25: qty 2

## 2017-06-25 MED ORDER — VANCOMYCIN HCL IN DEXTROSE 1-5 GM/200ML-% IV SOLN
1000.0000 mg | Freq: Once | INTRAVENOUS | Status: AC
  Administered 2017-06-25: 1000 mg via INTRAVENOUS
  Filled 2017-06-25: qty 200

## 2017-06-25 MED ORDER — SODIUM CHLORIDE 0.9 % IV BOLUS (SEPSIS)
250.0000 mL | Freq: Once | INTRAVENOUS | Status: AC
  Administered 2017-06-25: 250 mL via INTRAVENOUS

## 2017-06-25 MED ORDER — ADENOSINE 6 MG/2ML IV SOLN
INTRAVENOUS | Status: AC
  Administered 2017-06-25: 6 mg
  Filled 2017-06-25: qty 6

## 2017-06-25 MED ORDER — SODIUM CHLORIDE 0.9 % IV BOLUS (SEPSIS)
1000.0000 mL | Freq: Once | INTRAVENOUS | Status: AC
  Administered 2017-06-25: 1000 mL via INTRAVENOUS

## 2017-06-25 MED ORDER — PROPOFOL 10 MG/ML IV BOLUS
0.5000 mg/kg | Freq: Once | INTRAVENOUS | Status: AC
  Administered 2017-06-25: 34 mg via INTRAVENOUS
  Filled 2017-06-25: qty 20

## 2017-06-25 MED ORDER — ENOXAPARIN SODIUM 40 MG/0.4ML ~~LOC~~ SOLN
40.0000 mg | SUBCUTANEOUS | Status: DC
  Administered 2017-06-25 – 2017-06-27 (×3): 40 mg via SUBCUTANEOUS
  Filled 2017-06-25 (×3): qty 0.4

## 2017-06-25 MED ORDER — SODIUM CHLORIDE 0.9 % IV SOLN
INTRAVENOUS | Status: DC
  Administered 2017-06-25 – 2017-06-28 (×5): via INTRAVENOUS

## 2017-06-25 MED ORDER — HYDROCODONE-ACETAMINOPHEN 5-325 MG PO TABS
1.0000 | ORAL_TABLET | ORAL | Status: DC | PRN
  Administered 2017-06-27: 1 via ORAL
  Filled 2017-06-25: qty 1

## 2017-06-25 MED ORDER — AMIODARONE LOAD VIA INFUSION
150.0000 mg | Freq: Once | INTRAVENOUS | Status: AC
  Administered 2017-06-25: 150 mg via INTRAVENOUS
  Filled 2017-06-25: qty 83.34

## 2017-06-25 MED ORDER — TAMSULOSIN HCL 0.4 MG PO CAPS
0.4000 mg | ORAL_CAPSULE | Freq: Every day | ORAL | Status: DC
  Administered 2017-06-25 – 2017-06-27 (×3): 0.4 mg via ORAL
  Filled 2017-06-25 (×3): qty 1

## 2017-06-25 MED ORDER — AMIODARONE HCL IN DEXTROSE 360-4.14 MG/200ML-% IV SOLN
60.0000 mg/h | INTRAVENOUS | Status: AC
  Administered 2017-06-25: 60 mg/h via INTRAVENOUS
  Filled 2017-06-25 (×2): qty 200

## 2017-06-25 MED ORDER — OXYBUTYNIN CHLORIDE 5 MG PO TABS
5.0000 mg | ORAL_TABLET | Freq: Three times a day (TID) | ORAL | Status: DC | PRN
  Administered 2017-06-26: 5 mg via ORAL
  Filled 2017-06-25: qty 1

## 2017-06-25 MED ORDER — ACETAMINOPHEN 325 MG PO TABS
650.0000 mg | ORAL_TABLET | Freq: Once | ORAL | Status: AC
  Administered 2017-06-25: 650 mg via ORAL
  Filled 2017-06-25: qty 2

## 2017-06-25 MED ORDER — DEXTROSE 5 % IV SOLN
1.0000 g | INTRAVENOUS | Status: DC
  Administered 2017-06-25 – 2017-06-27 (×3): 1 g via INTRAVENOUS
  Filled 2017-06-25 (×4): qty 10

## 2017-06-25 MED ORDER — AMIODARONE HCL IN DEXTROSE 360-4.14 MG/200ML-% IV SOLN
30.0000 mg/h | INTRAVENOUS | Status: AC
  Administered 2017-06-26 (×3): 30 mg/h via INTRAVENOUS
  Filled 2017-06-25 (×2): qty 200

## 2017-06-25 NOTE — ED Notes (Signed)
xray at bedside.  

## 2017-06-25 NOTE — ED Notes (Signed)
Patient attempted to use female urinal, unable to void at time. MD notified.

## 2017-06-25 NOTE — ED Provider Notes (Signed)
[facility] DEPT Provider Note   CSN: [id number] Arrival date & time: 06/25/17  1052     History   Chief Complaint Chief Complaint  Patient presents with  . Hypotension    HPI Veronica Woods is a 61 y.o. female.  The history is provided by the patient. No language interpreter was used.   Veronica Woods is a 61 y.o. female who presents to the Emergency Department complaining of fever.  On Wednesday she had ureteroscopy with stent placement for kidney stone.  After discharge that night she developed a temperature to 100.  Last night she developed fever to 101.9 with left-sided abdominal pain.  She reports nausea with mild shortness of breath.  She is having bloody urine.  She is not currently on antibiotics.  She went to [facility] urology today she received a shot of Rocephin, 1 g IM and was referred to the emergency department.  Sxs are severe, constant, worsening.   History reviewed. No pertinent past medical history.  Patient Active Problem List   Diagnosis Date Noted  . Flank pain 06/04/2017  . Left lower quadrant pain 06/04/2017  . Kidney stone 06/04/2017  . Hematuria 05/25/2017  . Acute UTI 05/25/2017  . Pelvic pain 05/11/2017  . Clinical infection 05/11/2017  . Lower urinary tract symptoms (LUTS) 05/11/2017  . Dysuria 05/11/2017    History reviewed. No pertinent surgical history.  OB History    No data available       Home Medications    Prior to Admission medications   Not on File    Family History Family History  Problem Relation Age of Onset  . Cancer Mother   . Heart disease Mother   . Hyperlipidemia Mother   . Hypertension Mother   . Stroke Mother   . Cancer Father     Social History Social History  Substance Use Topics  . Smoking status: Never Smoker  . Smokeless tobacco: Never Used  . Alcohol use No     Allergies   Sulfur   Review of Systems Review of Systems  All other systems reviewed and are  negative.    Physical Exam Updated Vital Signs BP (!) 85/59   Pulse (!) 112   Temp 99.9 F (37.7 C) (Rectal)   Resp (!) 25   Ht 5\' 4"  (1.626 m)   Wt 68 kg (150 lb)   SpO2 100%   BMI 25.75 kg/m   Physical Exam  Constitutional: She is oriented to person, place, and time. She appears well-developed and well-nourished.  HENT:  Head: Normocephalic and atraumatic.  Dry mucous membranes  Cardiovascular:  No murmur heard. Tachycardic and irregular  Pulmonary/Chest: Effort normal and breath sounds normal. No respiratory distress.  Abdominal: Soft. There is no tenderness. There is no rebound and no guarding.  Musculoskeletal: She exhibits no edema or tenderness.  Neurological: She is alert and oriented to person, place, and time.  Skin: Skin is warm and dry. There is pallor.  Psychiatric: She has a normal mood and affect. Her behavior is normal.  Nursing note and vitals reviewed.    ED Treatments / Results  Labs (all labs ordered are listed, but only abnormal results are displayed) Labs Reviewed  COMPREHENSIVE METABOLIC PANEL - Abnormal; Notable for the following:       Result Value   Potassium 3.4 (*)    CO2 18 (*)    Glucose, Bld 102 (*)    Creatinine, Ser 1.37 (*)  Calcium 8.8 (*)    Albumin 3.3 (*)    AST 43 (*)    Alkaline Phosphatase 150 (*)    Total Bilirubin 2.2 (*)    GFR calc non Af Amer 41 (*)    GFR calc Af Amer 47 (*)    Anion gap 17 (*)    All other components within normal limits  CBC WITH DIFFERENTIAL/PLATELET - Abnormal; Notable for the following:    WBC 15.3 (*)    All other components within normal limits  I-STAT CG4 LACTIC ACID, ED - Abnormal; Notable for the following:    Lactic Acid, Venous 3.35 (*)    All other components within normal limits  I-STAT TROPONIN, ED - Abnormal; Notable for the following:    Troponin i, poc 0.17 (*)    All other components within normal limits  I-STAT CHEM 8, ED - Abnormal; Notable for the following:     Creatinine, Ser 1.20 (*)    Calcium, Ion 1.08 (*)    TCO2 21 (*)    All other components within normal limits  CULTURE, BLOOD (ROUTINE X 2)  CULTURE, BLOOD (ROUTINE X 2)  URINE CULTURE  URINALYSIS, ROUTINE W REFLEX MICROSCOPIC    EKG  EKG Interpretation None       Radiology No results found.  Procedures .Sedation Date/Time: 06/25/2017 1:18 PM Performed by: Quintella Reichert Authorized by: Quintella Reichert   Consent:    Consent given by:  Patient   Risks discussed:  Dysrhythmia, inadequate sedation and nausea Universal protocol:    Immediately prior to procedure a time out was called: yes   Indications:    Procedure performed:  Cardioversion   Procedure necessitating sedation performed by:  Physician performing sedation   Intended level of sedation:  Moderate (conscious sedation) Pre-sedation assessment:    Time since last food or drink:  Yesterday   ASA classification: class 3 - patient with severe systemic disease     Neck mobility: normal     Mallampati score:  III - soft palate, base of uvula visible   Pre-sedation assessments completed and reviewed: airway patency, cardiovascular function, hydration status, mental status, nausea/vomiting, pain level and respiratory function   Immediate pre-procedure details:    Reviewed: vital signs     Verified: bag valve mask available, emergency equipment available, intubation equipment available, IV patency confirmed, oxygen available and suction available   Procedure details (see MAR for exact dosages):    Preoxygenation:  Nasal cannula   Sedation:  Propofol   Intra-procedure monitoring:  Blood pressure monitoring, cardiac monitor, continuous capnometry, continuous pulse oximetry, frequent LOC assessments and frequent vital sign checks   Intra-procedure events: none     Total Provider sedation time (minutes):  11 .Cardioversion Date/Time: 06/25/2017 1:19 PM Performed by: Quintella Reichert Authorized by: Quintella Reichert    Consent:    Consent given by:  Patient   Risks discussed:  Cutaneous burn, induced arrhythmia and pain Pre-procedure details:    Cardioversion basis:  Emergent   Rhythm:  Atrial fibrillation   Electrode placement:  Anterior-posterior Patient sedated: Yes. Refer to sedation procedure documentation for details of sedation.  Attempt one:    Cardioversion mode:  Synchronous   Shock (Joules):  200   Shock outcome:  No change in rhythm Post-procedure details:    Patient status:  Awake   Patient tolerance of procedure:  Tolerated well, no immediate complications    CRITICAL CARE Performed by: Quintella Reichert   Total critical care time: 28  minutes  Critical care time was exclusive of separately billable procedures and treating other patients.  Critical care was necessary to treat or prevent imminent or life-threatening deterioration.  Critical care was time spent personally by me on the following activities: development of treatment plan with patient and/or surrogate as well as nursing, discussions with consultants, evaluation of patient's response to treatment, examination of patient, obtaining history from patient or surrogate, ordering and performing treatments and interventions, ordering and review of laboratory studies, ordering and review of radiographic studies, pulse oximetry and re-evaluation of patient's condition.   Medications Ordered in ED Medications  sodium chloride 0.9 % bolus 1,000 mL (0 mLs Intravenous Stopped 06/25/17 1214)    And  sodium chloride 0.9 % bolus 1,000 mL (1,000 mLs Intravenous New Bag/Given 06/25/17 1153)    And  sodium chloride 0.9 % bolus 250 mL (not administered)  vancomycin (VANCOCIN) IVPB 1000 mg/200 mL premix (1,000 mg Intravenous New Bag/Given 06/25/17 1200)  adenosine (ADENOCARD) 6 MG/2ML injection (6 mg  Given 06/25/17 1148)  acetaminophen (TYLENOL) tablet 650 mg (650 mg Oral Given 06/25/17 1204)  CHA2DS2/VAS Stroke Risk Points      1 >=  2 Points: High Risk  1 - 1.99 Points: Medium Risk  0 Points: Low Risk    The patient's score has not changed in the past year.:  No Change         Details    Note: External data might be a factor in metrics not marked with    Points Metrics   This score determines the patient's risk of having a stroke if the  patient has atrial fibrillation.       0 Has Congestive Heart Failure:  No   0 Has Vascular Disease:  No   0 Has Hypertension:  No   0 Age:  53   0 Has Diabetes:  No   0 Had Stroke:  No Had TIA:  No Had thromboembolism:  No   1 Female:  Yes           Initial Impression / Assessment and Plan / ED Course  I have reviewed the triage vital signs and the nursing notes.  Pertinent labs & imaging results that were available during my care of the patient were reviewed by me and considered in my medical decision making (see chart for details).     Pt here with fever from home, tachycardic and hypotensive in the ED.  She is in new onset afib with RVR with hypotension.  Attempted rate control/conversion with vagal maneuvers, adenosine without success.  Attempted cardioversion with no change in rhythm.  D/w Cardiology - amiodarone initiated with conversion to sinus rhythm.  Pt with hypotension, elevated lactate - cause is multifactorial due to arrhythmia as well as sepsis.  Her BP did improve after chemical cardioversion.  Pt mentated well throughout her ED stay.  She received Rocephin prior to ED arrival and Vanc was added in ED.   D/w Dr. Gloriann Loan with Urology who saw the patient in the ED.  Hospitalist consulted for admission for further treatment.    Final Clinical Impressions(s) / ED Diagnoses   Final diagnoses:  Atrial fibrillation with rapid ventricular response ([place])  Sepsis, due to unspecified organism [facility])    New Prescriptions New Prescriptions   No medications on file     Quintella Reichert, MD 06/25/17 [place]

## 2017-06-25 NOTE — ED Notes (Signed)
MD have been made aware of PT repeat lactic

## 2017-06-25 NOTE — ED Notes (Signed)
ED Provider at bedside. 

## 2017-06-25 NOTE — ED Notes (Signed)
Hospitalist at bedside 

## 2017-06-25 NOTE — Progress Notes (Signed)
Amiodarone Drug - Drug Interaction Consult Note  Recommendations:  No interacting medications found on either inpatient or PTA profiles. Pharmacy will reassess as new medications are prescribed   Amiodarone is metabolized by the cytochrome P450 system and therefore has the potential to cause many drug interactions. Amiodarone has an average plasma half-life of 50 days (range 20 to 100 days).   There is potential for drug interactions to occur several weeks or months after stopping treatment and the onset of drug interactions may be slow after initiating amiodarone.   []  Statins: Increased risk of myopathy. Simvastatin- restrict dose to 20mg  daily. Other statins: counsel patients to report any muscle pain or weakness immediately.  []  Anticoagulants: Amiodarone can increase anticoagulant effect. Consider warfarin dose reduction. Patients should be monitored closely and the dose of anticoagulant altered accordingly, remembering that amiodarone levels take several weeks to stabilize.  []  Antiepileptics: Amiodarone can increase plasma concentration of phenytoin, the dose should be reduced. Note that small changes in phenytoin dose can result in large changes in levels. Monitor patient and counsel on signs of toxicity.  []  Beta blockers: increased risk of bradycardia, AV block and myocardial depression. Sotalol - avoid concomitant use.  []   Calcium channel blockers (diltiazem and verapamil): increased risk of bradycardia, AV block and myocardial depression.  []   Cyclosporine: Amiodarone increases levels of cyclosporine. Reduced dose of cyclosporine is recommended.  []  Digoxin dose should be halved when amiodarone is started.  []  Diuretics: increased risk of cardiotoxicity if hypokalemia occurs.  []  Oral hypoglycemic agents (glyburide, glipizide, glimepiride): increased risk of hypoglycemia. Patient's glucose levels should be monitored closely when initiating amiodarone therapy.   []  Drugs  that prolong the QT interval:  Torsades de pointes risk may be increased with concurrent use - avoid if possible.  Monitor QTc, also keep magnesium/potassium WNL if concurrent therapy can't be avoided. Marland Kitchen Antibiotics: e.g. fluoroquinolones, erythromycin. . Antiarrhythmics: e.g. quinidine, procainamide, disopyramide, sotalol. . Antipsychotics: e.g. phenothiazines, haloperidol.  . Lithium, tricyclic antidepressants, and methadone.  Thank You,  Reuel Boom, PharmD, BCPS Pager: [phone]  06/25/2017, 1:41 PM

## 2017-06-25 NOTE — Consult Note (Signed)
Cardiology Consultation:   Patient ID: Veronica Woods; [id number]; 03/26/1956   Admit date: 06/25/2017 Date of Consult: 06/25/2017  Primary Care Provider: Horald Pollen, MD Primary Cardiologist: New Primary Electrophysiologist:  N/A   Patient Profile:   Veronica Woods is a 61 y.o. female without previous medical problems who is being seen today for the evaluation of atrial fibrillation with rapid ventricular response at the request of Dr. Doyle Woods.  History of Present Illness:   [name] presented with an acute febrile illness, hematuria and left flank pain, a couple of days after receiving a ureteral stent for a kidney stone associated with recurrent urinary tract infections.  She was found to be hypotensive and extremely tachycardic due to atrial fibrillation with rapid ventricular response.  She was felt to be septic.  She received antibiotics.  Urgent cardioversion was performed unsuccessfully.  Intravenous amiodarone has subsequently been started and she has converted to normal sinus rhythm.  She remains borderline hypotensive even after fluid resuscitation.  She felt mildly short of breath but this has now improved.  She did not have chest discomfort at any point and has not experienced syncope.  She was only intermittently aware of the palpitations after lying in the emergency room for a long period of time.  Specifically, her past medical history is significant for the absence of hypertension, diabetes, known valvular heart disease, coronary problems, stroke or TIA, other embolic events, bleeding tendency, congestive heart failure or other arrhythmia.  Her mother is 72 years old and has had atrial fibrillation for a long time.  Her sister had spontaneous coronary artery dissection a few years ago.  History reviewed. No pertinent past medical history.  Had scarlet fever as a child, treated promptly with antibiotics  History reviewed.  Cystoureteroscopy and stent placement, left  ureter, June 23, 2017  Home Medications:  Prior to Admission medications   Medication Sig Start Date End Date Taking? Authorizing Provider  HYDROcodone-acetaminophen (NORCO/VICODIN) 5-325 MG tablet Take 1 tablet by mouth every 4 (four) hours as needed (For pain.).  06/23/17  Yes [provider]  oxybutynin (DITROPAN) 5 MG tablet Take 5 mg by mouth every 8 (eight) hours as needed for bladder spasms.  06/23/17  Yes [provider]  tamsulosin (FLOMAX) 0.4 MG CAPS capsule Take 0.4 mg by mouth daily after supper.  06/23/17  Yes [provider]    Inpatient Medications: Scheduled Meds:  Continuous Infusions: . amiodarone 60 mg/hr (06/25/17 1344)   Followed by  . amiodarone    . sodium chloride     PRN Meds:   Allergies:    Allergies  Allergen Reactions  . Sulfur Anaphylaxis and Other (See Comments)    Childhood reaction    Social History:   Social History   Social History  . Marital status: Married    Spouse name: N/A  . Number of children: N/A  . Years of education: N/A   Occupational History  . Not on file.   Social History Main Topics  . Smoking status: Never Smoker  . Smokeless tobacco: Never Used  . Alcohol use No  . Drug use: No  . Sexual activity: Not on file   Other Topics Concern  . Not on file   Social History Narrative  . No narrative on file    Family History:   Her mother is 25 years old and has had atrial fibrillation for a long time.  Her sister had spontaneous coronary artery dissection a  few years ago. Family History  Problem Relation Age of Onset  . Cancer Mother   . Heart disease Mother   . Hyperlipidemia Mother   . Hypertension Mother   . Stroke Mother   . Cancer Father      ROS:  Please see the history of present illness.  ROS  All other ROS reviewed and negative.     Physical Exam/Data:   Vitals:   06/25/17 1415 06/25/17 1430 06/25/17 1445 06/25/17 1500  BP: (!) 86/69 (!) 84/67 (!) 85/64 (!)  82/62  Pulse: (!) 108 (!) 102 98 93  Resp: (!) 21 17 (!) 23 19  Temp:      TempSrc:      SpO2: 98% 98% 96% 98%  Weight:      Height:        Intake/Output Summary (Last 24 hours) at 06/25/17 1612 Last data filed at 06/25/17 1300  Gross per 24 hour  Intake             2450 ml  Output                0 ml  Net             2450 ml   Filed Weights   06/25/17 1144  Weight: 150 lb (68 kg)   Body mass index is 25.75 kg/m.  General:  Well nourished, well developed, in no acute distress HEENT: normal Lymph: no adenopathy Neck: no JVD Endocrine:  No thryomegaly Vascular: No carotid bruits; FA pulses 2+ bilaterally without bruits  Cardiac:  normal S1, S2; RRR; no murmur.  She does appear to have a midsystolic click, but a murmur cannot be elicited even with provocative maneuvers. Lungs:  clear to auscultation bilaterally, no wheezing, rhonchi or rales  Abd: soft, nontender, no hepatomegaly  Ext: no edema Musculoskeletal:  No deformities, BUE and BLE strength normal and equal Skin: warm and dry  Neuro:  CNs 2-12 intact, no focal abnormalities noted Psych:  Normal affect   EKG:  The EKG was personally reviewed and demonstrates: atriall fibrillation with rapid ventricular response, minimal rate related ST segment depression.  Following conversion the ECG shows normal rhythm and is really a pretty normal tracing. Telemetry:  Telemetry was personally reviewed and demonstrates: Now in sinus rhythm  Relevant CV Studies: None available for review.  The abdominal CT performed for her ureterolithiasis is significant for the absence of atherosclerotic calcification in the aorta and major branches  Laboratory Data:  Chemistry Recent Labs Lab 06/25/17 1130 06/25/17 1201  NA 136 135  K 3.4* 3.5  CL 101 102  CO2 18*  --   GLUCOSE 102* 97  BUN 20 19  CREATININE 1.37* 1.20*  CALCIUM 8.8*  --   GFRNONAA 41*  --   GFRAA 47*  --   ANIONGAP 17*  --      Recent Labs Lab 06/25/17 1130    PROT 6.8  ALBUMIN 3.3*  AST 43*  ALT 28  ALKPHOS 150*  BILITOT 2.2*   Hematology Recent Labs Lab 06/25/17 1130 06/25/17 1201  WBC 15.3*  --   RBC 4.40  --   HGB 13.7 13.3  HCT 39.0 39.0  MCV 88.6  --   MCH 31.1  --   MCHC 35.1  --   RDW 12.8  --   PLT 117*  --    Cardiac EnzymesNo results for input(s): TROPONINI in the last 168 hours.  Recent Labs Lab 06/25/17 1139  TROPIPOC 0.17*    BNPNo results for input(s): BNP, PROBNP in the last 168 hours.  DDimer No results for input(s): DDIMER in the last 168 hours.  Radiology/Studies:  Dg Chest Port 1 View  Result Date: 06/25/2017 CLINICAL DATA:  Fever and shortness of breath. Recent stone removal 2 days ago. EXAM: PORTABLE CHEST 1 VIEW COMPARISON:  None. FINDINGS: The cardiomediastinal silhouette is normal in size. Normal pulmonary vascularity. No focal consolidation, pleural effusion, or pneumothorax. No acute osseous abnormality. IMPRESSION: No active disease. Electronically Signed   By: Titus Dubin M.D.   On: 06/25/2017 12:29    Assessment and Plan:   1. AFib: Possible that this is a one-time occurrence related to severe acute illness.  Would continue intravenous amiodarone for the full 24-hour load but then will discontinue it and would not replace it with other medications unless there is recurrence of arrhythmia.  After hospital discharge would recommend a 30-day event monitor to see if she is having asymptomatic episodes of atrial fibrillation.  Embolic risk is low and I would not recommend anticoagulation either way. CHADSVasc 0-1 (female gender only).  If atrial fibrillation is detected after hospital discharge it would however be reasonable to treat with aspirin. 2. Systolic click: Consider a possible diagnosis of mitral valve prolapse.  We will check an echocardiogram.  (The above considerations regarding anticoagulation would not change unless she is found to have rheumatic mitral stenosis or other serious cardiac  illness, not apparent by physical exam). 3. Septic shock: She appears to be improving rapidly.   For questions or updates, please contact [place] Please consult [email or web address] for contact info under Cardiology/STEMI.   Signed, Sanda Klein, MD  06/25/2017 4:12 PM

## 2017-06-25 NOTE — ED Notes (Signed)
RN AND MD NOTIFIED OF PATIENT'S LACTIC ACID OF 3.35

## 2017-06-25 NOTE — H&P (Signed)
History and Physical    Veronica Woods QBH:[id number] DOB: 01/27/1956 DOA: 06/25/2017  Referring MD/NP/PA: Dr. Ayesha Rumpf   PCP: Horald Pollen, MD   Patient coming from: home  Chief Complaint: fever  HPI: Veronica Woods is a 61 y.o. female who underwent cystourethroscopy and stent placement, left ureter on 06/23/2017 (patient with known history of kidney stone and recurrent UTIs), developed fever day after procedure and returned to office today with main concern of progressively worsening lower quadrants abdominal pain and fevers up to 102F. Due to concern for sepsis, she has received dose of Rocephin in the office and was referred to emergency department for further evaluation. In the emergency department she was found to be hypotensive and tachycardic, with heart rate up to 200s, was in atrial fibrillation with RVR which was felt to be due to sepsis. Urgent cardioversion was performed unsuccessfully.cardiology was consulted, recommended amiodarone and patient converted to sinus rhythm. She has received total of 2.5 L of normal saline in ER and has remained hypotensive with systolic blood pressure 09B to 90s. Patient was also given vancomycin in emergency department and [facility] was asked to admit for further evaluation. At the time of the admission, patient reports feeling better, confirms persistent lower abdominal quadrant pain, left worse than right, fairly constant and throbbing, 7/10 in severity, occasionally but not consistently radiating across the abdominal area. Patient reports associated nausea but no vomiting. Patient denies chest pain and shortness of breath at this time, denies fevers and chills since arrival to emergency department. Patient also denies any specific focal neurological symptoms such as numbness or tingling, no visual changes or headaches.  Review of Systems:  Constitutional: Negative for appetite change and fatigue.  HENT: Negative for ear pain, nosebleeds, congestion,  facial swelling, rhinorrhea, neck pain, neck stiffness and ear discharge.   Eyes: Negative for pain, discharge, redness, itching and visual disturbance.  Respiratory: Negative for cough, choking, chest tightness, shortness of breath, wheezing and stridor.   Cardiovascular: Negative for chest pain, palpitations and leg swelling.  Gastrointestinal: Negative for abdominal distention.  Genitourinary: Negative for dysuria, urgency Musculoskeletal: Negative for back pain, joint swelling, arthralgias and gait problem.  Neurological: Negative for dizziness, tremors, seizures, syncope, facial asymmetry, speech difficulty, weakness, light-headedness, numbness and headaches.  Hematological: Negative for adenopathy. Does not bruise/bleed easily.  Psychiatric/Behavioral: Negative for hallucinations, behavioral problems, confusion, dysphoric mood, decreased concentration and agitation.   Past medical history: chronic urinary tract infections, kidney stones  Social history: patient denies alcohol use, has not used tobacco  Allergies  Allergen Reactions  . Sulfur Anaphylaxis and Other (See Comments)    Childhood reaction    Family History  Problem Relation Age of Onset  . Cancer Mother   . Heart disease Mother   . Hyperlipidemia Mother   . Hypertension Mother   . Stroke Mother   . Cancer Father     Prior to Admission medications   Medication Sig Start Date End Date Taking? Authorizing Provider  HYDROcodone-acetaminophen (NORCO/VICODIN) 5-325 MG tablet Take 1 tablet by mouth every 4 (four) hours as needed (For pain.).  06/23/17  Yes [provider]  oxybutynin (DITROPAN) 5 MG tablet Take 5 mg by mouth every 8 (eight) hours as needed for bladder spasms.  06/23/17  Yes [provider]  tamsulosin (FLOMAX) 0.4 MG CAPS capsule Take 0.4 mg by mouth daily after supper.  06/23/17  Yes [provider]    Physical Exam: Vitals:   06/25/17 3532  06/25/17 1500 06/25/17 1607  06/25/17 1632  BP: (!) 85/64 (!) 82/62 93/72 101/78  Pulse: 98 93 94 96  Resp: (!) 23 19 12 16   Temp:      TempSrc:      SpO2: 96% 98% 99% 98%  Weight:      Height:        Constitutional: NAD, calm, comfortable Vitals:   06/25/17 1445 06/25/17 1500 06/25/17 1607 06/25/17 1632  BP: (!) 85/64 (!) 82/62 93/72 101/78  Pulse: 98 93 94 96  Resp: (!) 23 19 12 16   Temp:      TempSrc:      SpO2: 96% 98% 99% 98%  Weight:      Height:       Eyes: PERRL, lids and conjunctivae normal ENMT: Mucous membranes are dry. Posterior pharynx clear of any exudate or lesions.Normal dentition.  Neck: normal, supple, no masses, no thyromegaly Respiratory: clear to auscultation bilaterally, no wheezing, no crackles. Normal respiratory effort. No accessory muscle use.  Cardiovascular: Regular rate and rhythm,  No carotid bruits.  Abdomen: slight tenderness in lower abdominal quadrants, normal bowel sounds  Musculoskeletal: no clubbing / cyanosis. No joint deformity upper and lower extremities. Good ROM, no contractures. Normal muscle tone.  Skin: no rashes, lesions, ulcers. No induration Neurologic: CN 2-12 grossly intact. Sensation intact, DTR normal. Strength 5/5 in all 4.  Psychiatric: Normal judgment and insight. Alert and oriented x 3. Normal mood.   Labs on Admission: I have personally reviewed following labs and imaging studies  CBC:  Recent Labs Lab 06/25/17 1130 06/25/17 1201  WBC 15.3*  --   NEUTROABS 13.9*  --   HGB 13.7 13.3  HCT 39.0 39.0  MCV 88.6  --   PLT 117*  --    Basic Metabolic Panel:  Recent Labs Lab 06/25/17 1130 06/25/17 1201  NA 136 135  K 3.4* 3.5  CL 101 102  CO2 18*  --   GLUCOSE 102* 97  BUN 20 19  CREATININE 1.37* 1.20*  CALCIUM 8.8*  --    Liver Function Tests:  Recent Labs Lab 06/25/17 1130  AST 43*  ALT 28  ALKPHOS 150*  BILITOT 2.2*  PROT 6.8  ALBUMIN 3.3*   Urine analysis:    Component Value Date/Time   BILIRUBINUR negative  06/04/2017 0900   KETONESUR negative 06/04/2017 0900   PROTEINUR =30 (A) 06/04/2017 0900   UROBILINOGEN 0.2 06/04/2017 0900   NITRITE Negative 06/04/2017 0900   LEUKOCYTESUR Negative 06/04/2017 0900   Radiological Exams on Admission: Dg Chest Port 1 View  Result Date: 06/25/2017 CLINICAL DATA:  Fever and shortness of breath. Recent stone removal 2 days ago. EXAM: PORTABLE CHEST 1 VIEW COMPARISON:  None. FINDINGS: The cardiomediastinal silhouette is normal in size. Normal pulmonary vascularity. No focal consolidation, pleural effusion, or pneumothorax. No acute osseous abnormality. IMPRESSION: No active disease. Electronically Signed   By: Titus Dubin M.D.   On: 06/25/2017 12:29    EKG: initial EKG with atrial fibrillation with RVR, currently normal sinus rhythm  Assessment/Plan Active Problems:   Sepsis ([place]) - suspect urinary source - agree with Rocephin for now - Sepsis protocol initiated - Blood and urine cultures requested - Patient has been given vancomycin, would hold off on further vancomycin at this time unless patient does not respond to Rocephin and IV fluids - Narrow antibiotic regimen once sensitivity report back and as clinically indicated - Agree with admission to step down unit - Continue IV fluids  and boluses as needed to support blood pressure - Provide analgesia and antiemetics as needed - Follow up on lactic acid, pro calcitonin    Acute kidney injury - Suspect prerenal etiology - Continue IV fluids - BMP in the morning    A. Fib with RVR - Cardiology consulted, will follow-up on recommendations - Appreciate assistance    DVT prophylaxis: Lovenox SQ Code Status: full Family Communication: patient and husband updated at bedside Disposition Plan: to be determined Consults called: cardiology Admission status: inpatient  Faye Ramsay MD [facility] Pager [phone]  If 7PM-7AM, please contact night-coverage [email or web address] Password  [email or web address]  06/25/2017, 5:10 PM

## 2017-06-25 NOTE — Progress Notes (Signed)
PHARMACY NOTE -  Ceftriaxone  Pharmacy has been consulted to dose Ceftriaxone for UTI. Will order Ceftriaxone 1g IV q24h.  No renal adjustment needed; therefore, pharmacy will sign off since need for further dosage adjustment appears unlikely at present.    Please reconsult if a change in clinical status warrants re-evaluation of dosage.  Thank you for the consult.  Hershal Coria, PharmD, BCPS Pager: [phone] 06/25/2017 5:27 PM

## 2017-06-25 NOTE — ED Notes (Signed)
RN AND MD NOTIFIED OF TROPONIN LEVEL 0.17

## 2017-06-25 NOTE — ED Triage Notes (Addendum)
Patient from [facility] due to hypotension. Reports having kidney stone removed Wednesday morning. Fever onset of yesterday. Patient reports given shot of rocephin IM PTA. Patient alert x 4. Speaking in full sentences.

## 2017-06-25 NOTE — ED Notes (Addendum)
Pt being sent by [facility] d/t fever following a procedure.  R/o sepsis.

## 2017-06-25 NOTE — ED Notes (Signed)
Respiratory called to be at bedside for procedure.

## 2017-06-25 NOTE — Consult Note (Addendum)
H&P Physician requesting consult: Quintella Reichert, MD  Chief Complaint: Sepsis  History of Present Illness: 61 year old female who recently on Wednesday underwent left ureteroscopy with laser lithotripsy and ureteral stent placement.  That night she had a low-grade fever.  Last night she had a fever of 102.  She presented to the office today with fever tachycardia and hypotension and was sent to the emergency department.  She was found to be in atrial fibrillation with RVR.  Cardioversion was attempted.  Her heart rate improved to the low 100s.  The patient states that she feels fatigued.  She has mild left lower quadrant pain.  History reviewed. No pertinent past medical history.--as above History reviewed. No pertinent surgical history.--as above  Home Medications:   (Not in a hospital admission) Allergies:  Allergies  Allergen Reactions  . Sulfur Anaphylaxis and Other (See Comments)    Childhood reaction    Family History  Problem Relation Age of Onset  . Cancer Mother   . Heart disease Mother   . Hyperlipidemia Mother   . Hypertension Mother   . Stroke Mother   . Cancer Father    Social History:  reports that she has never smoked. She has never used smokeless tobacco. She reports that she does not drink alcohol or use drugs.  ROS: A complete review of systems was performed.  All systems are negative except for pertinent findings as noted. ROS   Physical Exam:  Vital signs in last 24 hours: Temp:  [97.6 F (36.4 C)-99.9 F (37.7 C)] 99.9 F (37.7 C) (10/26 1156) Pulse Rate:  [61-171] 93 (10/26 1500) Resp:  [16-29] 19 (10/26 1500) BP: (69-97)/(42-69) 82/62 (10/26 1500) SpO2:  [94 %-100 %] 98 % (10/26 1500) Weight:  [68 kg (150 lb)] 68 kg (150 lb) (10/26 1144)  General:  Alert and oriented, moderate acute distress HEENT: Normocephalic, atraumatic Cardiovascular: Patient is in atrial fibrillation with a rate in the low 100s. Lungs: Regular effort, mild  tachypnea Abdomen: Soft, nontender, nondistended, no abdominal masses Back: No CVA tenderness Extremities: No edema Neurologic: Grossly intact  Laboratory Data:  Results for orders placed or performed during the hospital encounter of 06/25/17 (from the past 24 hour(s))  Comprehensive metabolic panel     Status: Abnormal   Collection Time: 06/25/17 11:30 AM  Result Value Ref Range   Sodium 136 135 - 145 mmol/L   Potassium 3.4 (L) 3.5 - 5.1 mmol/L   Chloride 101 101 - 111 mmol/L   CO2 18 (L) 22 - 32 mmol/L   Glucose, Bld 102 (H) 65 - 99 mg/dL   BUN 20 6 - 20 mg/dL   Creatinine, Ser 1.37 (H) 0.44 - 1.00 mg/dL   Calcium 8.8 (L) 8.9 - 10.3 mg/dL   Total Protein 6.8 6.5 - 8.1 g/dL   Albumin 3.3 (L) 3.5 - 5.0 g/dL   AST 43 (H) 15 - 41 U/L   ALT 28 14 - 54 U/L   Alkaline Phosphatase 150 (H) 38 - 126 U/L   Total Bilirubin 2.2 (H) 0.3 - 1.2 mg/dL   GFR calc non Af Amer 41 (L) >60 mL/min   GFR calc Af Amer 47 (L) >60 mL/min   Anion gap 17 (H) 5 - 15  CBC WITH DIFFERENTIAL     Status: Abnormal   Collection Time: 06/25/17 11:30 AM  Result Value Ref Range   WBC 15.3 (H) 4.0 - 10.5 K/uL   RBC 4.40 3.87 - 5.11 MIL/uL   Hemoglobin 13.7 12.0 -  15.0 g/dL   HCT 39.0 36.0 - 46.0 %   MCV 88.6 78.0 - 100.0 fL   MCH 31.1 26.0 - 34.0 pg   MCHC 35.1 30.0 - 36.0 g/dL   RDW 12.8 11.5 - 15.5 %   Platelets 117 (L) 150 - 400 K/uL   Neutrophils Relative % 91 %   Lymphocytes Relative 6 %   Monocytes Relative 3 %   Eosinophils Relative 0 %   Basophils Relative 0 %   Band Neutrophils 0 %   Metamyelocytes Relative 0 %   Myelocytes 0 %   Promyelocytes Absolute 0 %   Blasts 0 %   nRBC 0 0 /100 WBC   Other 0 %   Neutro Abs 13.9 (H) 1.7 - 7.7 K/uL   Lymphs Abs 0.9 0.7 - 4.0 K/uL   Monocytes Absolute 0.5 0.1 - 1.0 K/uL   Eosinophils Absolute 0.0 0.0 - 0.7 K/uL   Basophils Absolute 0.0 0.0 - 0.1 K/uL   Smear Review MORPHOLOGY UNREMARKABLE   I-stat troponin, ED (not at [facility], [facility])     Status: Abnormal    Collection Time: 06/25/17 11:39 AM  Result Value Ref Range   Troponin i, poc 0.17 (HH) 0.00 - 0.08 ng/mL   Comment NOTIFIED PHYSICIAN    Comment 3          I-Stat CG4 Lactic Acid, ED  (not at  [facility])     Status: Abnormal   Collection Time: 06/25/17 11:42 AM  Result Value Ref Range   Lactic Acid, Venous 3.35 (HH) 0.5 - 1.9 mmol/L   Comment NOTIFIED PHYSICIAN   I-stat Chem 8, ED     Status: Abnormal   Collection Time: 06/25/17 12:01 PM  Result Value Ref Range   Sodium 135 135 - 145 mmol/L   Potassium 3.5 3.5 - 5.1 mmol/L   Chloride 102 101 - 111 mmol/L   BUN 19 6 - 20 mg/dL   Creatinine, Ser 1.20 (H) 0.44 - 1.00 mg/dL   Glucose, Bld 97 65 - 99 mg/dL   Calcium, Ion 1.08 (L) 1.15 - 1.40 mmol/L   TCO2 21 (L) 22 - 32 mmol/L   Hemoglobin 13.3 12.0 - 15.0 g/dL   HCT 39.0 36.0 - 46.0 %  I-Stat CG4 Lactic Acid, ED  (not at  [facility])     Status: Abnormal   Collection Time: 06/25/17  1:44 PM  Result Value Ref Range   Lactic Acid, Venous 2.59 (HH) 0.5 - 1.9 mmol/L   Comment NOTIFIED PHYSICIAN    No results found for this or any previous visit (from the past 240 hour(s)). Creatinine:  Recent Labs  06/25/17 1130 06/25/17 1201  CREATININE 1.37* 1.20*    Impression/Assessment:  Sepsis secondary to urinary tract infection likely secondary to recent left ureteroscopy with laser lithotripsy and ureteral stent placement  Plan:  Agree with urine and blood culture.  Agree with broad-spectrum antibiotics with plans to narrow coverage to a p.o. antibiotic once cultures speciate with sensitivity.  Agree with evaluation by internal medicine for management of sepsis.  She will need to keep her ureteral stent in place. Recommend oxybutynin every 8 hours PRN bladder spasms from the stent. Belladonna/opium suppositories as needed may also be used.  Flomax 0.4mg  daily may also be added for stent discomfort.  Marton Redwood, III 06/25/2017, 4:14 PM

## 2017-06-26 DIAGNOSIS — I4891 Unspecified atrial fibrillation: Secondary | ICD-10-CM

## 2017-06-26 LAB — CBC
HCT: 30.8 % — ABNORMAL LOW (ref 36.0–46.0)
Hemoglobin: 10.7 g/dL — ABNORMAL LOW (ref 12.0–15.0)
MCH: 30.7 pg (ref 26.0–34.0)
MCHC: 34.7 g/dL (ref 30.0–36.0)
MCV: 88.5 fL (ref 78.0–100.0)
PLATELETS: 106 10*3/uL — AB (ref 150–400)
RBC: 3.48 MIL/uL — ABNORMAL LOW (ref 3.87–5.11)
RDW: 13.2 % (ref 11.5–15.5)
WBC: 10.3 10*3/uL (ref 4.0–10.5)

## 2017-06-26 LAB — TROPONIN I
TROPONIN I: 0.09 ng/mL — AB (ref <0.03)
TROPONIN I: 0.07 ng/mL — AB (ref <0.03)
Troponin I: 0.08 ng/mL (ref <0.03)

## 2017-06-26 LAB — BASIC METABOLIC PANEL
ANION GAP: 9 (ref 5–15)
BUN: 12 mg/dL (ref 6–20)
CALCIUM: 7.6 mg/dL — AB (ref 8.9–10.3)
CO2: 19 mmol/L — AB (ref 22–32)
CREATININE: 0.67 mg/dL (ref 0.44–1.00)
Chloride: 111 mmol/L (ref 101–111)
GFR calc Af Amer: >60 mL/min (ref >60)
GLUCOSE: 98 mg/dL (ref 65–99)
Potassium: 3.1 mmol/L — ABNORMAL LOW (ref 3.5–5.1)
Sodium: 139 mmol/L (ref 135–145)

## 2017-06-26 LAB — HIV ANTIBODY (ROUTINE TESTING W REFLEX): HIV SCREEN 4TH GENERATION: NONREACTIVE

## 2017-06-26 LAB — TSH: TSH: 1.013 u[IU]/mL (ref 0.350–4.500)

## 2017-06-26 MED ORDER — POTASSIUM CHLORIDE CRYS ER 20 MEQ PO TBCR
40.0000 meq | EXTENDED_RELEASE_TABLET | Freq: Once | ORAL | Status: AC
  Administered 2017-06-26: 40 meq via ORAL
  Filled 2017-06-26: qty 2

## 2017-06-26 NOTE — Progress Notes (Signed)
Pts amiodarone turned off around 1330, but should have remained running until 2000. Md called about ectopy and pt reporting palpatations, drip restarted until 2100.

## 2017-06-26 NOTE — Progress Notes (Signed)
Subjective:  She is feeling well this morning and converted to sinus rhythm yesterday.  Mildly hypokalemic today.  Complains of mild soreness in her chest following the cardioversion.  Objective:  Vital Signs in the last 24 hours: BP 110/73   Pulse 79   Temp 98.3 F (36.8 C) (Oral)   Resp 18   Ht 5\' 4"  (1.626 m)   Wt 70.4 kg (155 lb 3.3 oz)   SpO2 96%   BMI 26.64 kg/m   Physical Exam: Pleasant female currently in no acute distress Lungs:  Clear Cardiac:  Regular rhythm, normal S1 and S2, no S3 Extremities:  No edema present  Intake/Output from previous day: 10/26 0701 - 10/27 0700 In: 3563.6 [I.V.:1063.6; IV Piggyback:2500] Out: 1375 [Urine:1375]  Weight Filed Weights   06/25/17 1144 06/26/17 0500  Weight: 68 kg (150 lb) 70.4 kg (155 lb 3.3 oz)    Lab Results: Basic Metabolic Panel:  Recent Labs  06/25/17 1130 06/25/17 1201 06/26/17 0322  NA 136 135 139  K 3.4* 3.5 3.1*  CL 101 102 111  CO2 18*  --  19*  GLUCOSE 102* 97 98  BUN 20 19 12   CREATININE 1.37* 1.20* 0.67   CBC:  Recent Labs  06/25/17 1130 06/25/17 1201 06/26/17 0322  WBC 15.3*  --  10.3  NEUTROABS 13.9*  --   --   HGB 13.7 13.3 10.7*  HCT 39.0 39.0 30.8*  MCV 88.6  --  88.5  PLT 117*  --  106*   Cardiac Enzymes: Troponin (Point of Care Test)  Recent Labs  06/25/17 1139  TROPIPOC 0.17*   Telemetry: Currently normal sinus rhythm with PACs  Assessment/Plan:  1.  Paroxysmal atrial fibrillation-likely related to recent septic shock 2.  Elevation of troponin normal possibly due to rapid atrial fibrillation and cardioversion-we will cycle  Recommendations:  Await results of echocardiogram.  Discontinue amiodarone after 24 hour load.  Replete potassium.      Kerry Hough  MD [facility]  06/26/2017, 9:12 AM

## 2017-06-26 NOTE — Progress Notes (Addendum)
Patient ID: Veronica Woods, female   DOB: May 28, 1956, 61 y.o.   MRN: [id number]    PROGRESS NOTE    ESHAL PROPPS  SNK:[id number] DOB: 08-27-1956 DOA: 06/25/2017  PCP: Horald Pollen, MD   Brief Narrative:  61 y.o. female who underwent cystourethroscopy and stent placement, left ureter on 06/23/2017 (patient with known history of kidney stone and recurrent UTIs), developed fever day after procedure and returned to office today with main concern of progressively worsening lower quadrants abdominal pain and fevers up to 102F. Due to concern for sepsis, she has received dose of Rocephin in the office and was referred to emergency department for further evaluation. In the emergency department she was found to be hypotensive and tachycardic, with heart rate up to 200s, was in atrial fibrillation with RVR which was felt to be due to sepsis. Urgent cardioversion was performed unsuccessfully.cardiology was consulted, recommended amiodarone and patient converted to sinus rhythm. She has received total of 2.5 L of normal saline in ER and has remained hypotensive with systolic blood pressure 93X to 90s. Patient was also given vancomycin in emergency department and [facility] was asked to admit for further evaluation.   Assessment & Plan: Active Problems:   Sepsis ([place]) - suspect urinary source - cont Rocephin day #2 - blood cultures with no growth to date, follow up on urine cultures     Acute kidney injury - Suspect prerenal etiology - responded to IVF, now resolved     A. Fib with RVR, elevated trop, demand ischemia  - Cardiology consulted - will continue to follow up on recommendations     Hypokalemia - supplement and repeat BMP in AM    Thrombocytopenia - suspect reactive - will monitor while inpatient   DVT prophylaxis: Lovenox SQ Code Status: Full  Family Communication: Patient at bedside  Disposition Plan: transfer to tele   Consultants:   Cardiology  Urology   Procedures:    None   Antimicrobials:   Rocephin 10/26 -->  Subjective: Pt reports feeling better.   Objective: Vitals:   06/26/17 0600 06/26/17 0800 06/26/17 0900 06/26/17 1000  BP: 110/73 (!) 78/54 116/73 127/75  Pulse: 79 81 87 80  Resp: 18 14 17 20   Temp:  98.3 F (36.8 C)    TempSrc:  Oral    SpO2: 96% 98% 97% 97%  Weight:      Height:        Intake/Output Summary (Last 24 hours) at 06/26/17 1224 Last data filed at 06/26/17 1000  Gross per 24 hour  Intake          3380.47 ml  Output             1375 ml  Net          2005.47 ml   Filed Weights   06/25/17 1144 06/26/17 0500  Weight: 68 kg (150 lb) 70.4 kg (155 lb 3.3 oz)    Examination:  General exam: Appears calm and comfortable  Respiratory system: Clear to auscultation. Respiratory effort normal. Cardiovascular system: S1 & S2 heard, RRR. No JVD, murmurs. No pedal edema. Gastrointestinal system: Abdomen is nondistended, soft and nontender. No organomegaly or masses felt.  Central nervous system: Alert and oriented. No focal neurological deficits. Extremities: Symmetric 5 x 5 power. Skin: No rashes, lesions or ulcers Psychiatry: Judgement and insight appear normal. Mood & affect appropriate.    Data Reviewed: I have personally reviewed following labs and imaging studies  CBC:  Recent Labs  Lab 06/25/17 1130 06/25/17 1201 06/26/17 0322  WBC 15.3*  --  10.3  NEUTROABS 13.9*  --   --   HGB 13.7 13.3 10.7*  HCT 39.0 39.0 30.8*  MCV 88.6  --  88.5  PLT 117*  --  427*   Basic Metabolic Panel:  Recent Labs Lab 06/25/17 1130 06/25/17 1201 06/26/17 0322  NA 136 135 139  K 3.4* 3.5 3.1*  CL 101 102 111  CO2 18*  --  19*  GLUCOSE 102* 97 98  BUN 20 19 12   CREATININE 1.37* 1.20* 0.67  CALCIUM 8.8*  --  7.6*   Liver Function Tests:  Recent Labs Lab 06/25/17 1130  AST 43*  ALT 28  ALKPHOS 150*  BILITOT 2.2*  PROT 6.8  ALBUMIN 3.3*   Coagulation Profile:  Recent Labs Lab 06/25/17 1748  INR  1.04   Cardiac Enzymes:  Recent Labs Lab 06/26/17 1110  TROPONINI 0.09*   Thyroid Function Tests:  Recent Labs  06/26/17 0322  TSH 1.013   Urine analysis:    Component Value Date/Time   COLORURINE AMBER (A) 06/25/2017 1127   APPEARANCEUR CLOUDY (A) 06/25/2017 1127   LABSPEC 1.015 06/25/2017 1127   PHURINE 5.0 06/25/2017 1127   GLUCOSEU NEGATIVE 06/25/2017 1127   HGBUR LARGE (A) 06/25/2017 1127   BILIRUBINUR NEGATIVE 06/25/2017 1127   BILIRUBINUR negative 06/04/2017 0900   KETONESUR 20 (A) 06/25/2017 1127   PROTEINUR 100 (A) 06/25/2017 1127   UROBILINOGEN 0.2 06/04/2017 0900   NITRITE NEGATIVE 06/25/2017 1127   LEUKOCYTESUR LARGE (A) 06/25/2017 1127   Recent Results (from the past 240 hour(s))  Blood Culture (routine x 2)     Status: None (Preliminary result)   Collection Time: 06/25/17 11:36 AM  Result Value Ref Range Status   Specimen Description BLOOD LEFT ANTECUBITAL  Final   Special Requests   Final    BOTTLES DRAWN AEROBIC AND ANAEROBIC Blood Culture adequate volume   Culture   Final    NO GROWTH < 24 HOURS Performed at [facility], [place] [address] [place], [place] [address]    Report Status PENDING  Incomplete  Blood Culture (routine x 2)     Status: None (Preliminary result)   Collection Time: 06/25/17 11:44 AM  Result Value Ref Range Status   Specimen Description BLOOD LEFT HAND  Final   Special Requests   Final    BOTTLES DRAWN AEROBIC AND ANAEROBIC Blood Culture adequate volume   Culture   Final    NO GROWTH < 24 HOURS Performed at [facility], [place] [address], [place], [place] [address]    Report Status PENDING  Incomplete  MRSA PCR Screening     Status: None   Collection Time: 06/25/17  5:04 PM  Result Value Ref Range Status   MRSA by PCR NEGATIVE NEGATIVE Final    Comment:        The GeneXpert MRSA Assay (FDA approved for NASAL specimens only), is one component of a comprehensive MRSA colonization surveillance program. It is  not intended to diagnose MRSA infection nor to guide or monitor treatment for MRSA infections.       Radiology Studies: Dg Chest Port 1 View  Result Date: 06/25/2017 CLINICAL DATA:  Fever and shortness of breath. Recent stone removal 2 days ago. EXAM: PORTABLE CHEST 1 VIEW COMPARISON:  None. FINDINGS: The cardiomediastinal silhouette is normal in size. Normal pulmonary vascularity. No focal consolidation, pleural effusion, or pneumothorax. No acute osseous  abnormality. IMPRESSION: No active disease. Electronically Signed   By: Titus Dubin M.D.   On: 06/25/2017 12:29   Scheduled Meds: . enoxaparin (LOVENOX) injection  40 mg Subcutaneous Q24H  . tamsulosin  0.4 mg Oral QPC supper   Continuous Infusions: . sodium chloride 100 mL/hr at 06/26/17 1000  . amiodarone 30 mg/hr (06/26/17 1000)  . cefTRIAXone (ROCEPHIN)  IV Stopped (06/25/17 1813)     LOS: 1 day   Time spent: 25 minutes   Faye Ramsay, MD [facility] Pager [phone]  If 7PM-7AM, please contact night-coverage [email or web address] Password [email or web address] 06/26/2017, 12:24 PM

## 2017-06-26 NOTE — Progress Notes (Signed)
  Subjective: No acute events overnight.  Pain controlled.  Denies N/V.  Voiding w/o difficulty.  Feeling much better this AM.  Objective: Vital signs in last 24 hours: Temp:  [97.5 F (36.4 C)-99.9 F (37.7 C)] 98.3 F (36.8 C) (10/27 0800) Pulse Rate:  [61-171] 79 (10/27 0600) Resp:  [12-29] 18 (10/27 0600) BP: (69-122)/(42-78) 110/73 (10/27 0600) SpO2:  [94 %-100 %] 96 % (10/27 0600) Weight:  [68 kg (150 lb)-70.4 kg (155 lb 3.3 oz)] 70.4 kg (155 lb 3.3 oz) (10/27 0500)  Intake/Output from previous day: 10/26 0701 - 10/27 0700 In: 3563.6 [I.V.:1063.6; IV Piggyback:2500] Out: 1375 [Urine:1375]  Intake/Output this shift: No intake/output data recorded.  Physical Exam:  General: Alert and oriented CV: RRR, palpable distal pulses Lungs: CTAB, equal chest rise Abdomen: Soft, NTND, no rebound or guarding Ext: NT, No erythema  Lab Results:  Recent Labs  06/25/17 1130 06/25/17 1201 06/26/17 0322  HGB 13.7 13.3 10.7*  HCT 39.0 39.0 30.8*   BMET  Recent Labs  06/25/17 1130 06/25/17 1201 06/26/17 0322  NA 136 135 139  K 3.4* 3.5 3.1*  CL 101 102 111  CO2 18*  --  19*  GLUCOSE 102* 97 98  BUN 20 19 12   CREATININE 1.37* 1.20* 0.67  CALCIUM 8.8*  --  7.6*     Studies/Results: Dg Chest Port 1 View  Result Date: 06/25/2017 CLINICAL DATA:  Fever and shortness of breath. Recent stone removal 2 days ago. EXAM: PORTABLE CHEST 1 VIEW COMPARISON:  None. FINDINGS: The cardiomediastinal silhouette is normal in size. Normal pulmonary vascularity. No focal consolidation, pleural effusion, or pneumothorax. No acute osseous abnormality. IMPRESSION: No active disease. Electronically Signed   By: Titus Dubin M.D.   On: 06/25/2017 12:29    Assessment/Plan: POD 3 s/p left ureteroscopy with subsequent urosepsis with septic shock- improving Atrial fibrillation- Cardiology following.  -Continue empiric antibiotic coverage with Rocephin.  WBC improving and she has remained  afebrile. Blood and urine cultures pending, will adjust as needed. -Ok for floor from a GU standpoint -Will continue to monitor   LOS: 1 day   Veronica Hughs, MD 06/26/2017, 9:11 AM  [facility] Pager: [phone]

## 2017-06-27 ENCOUNTER — Inpatient Hospital Stay (HOSPITAL_COMMUNITY): Payer: BC Managed Care – PPO

## 2017-06-27 LAB — BASIC METABOLIC PANEL
ANION GAP: 7 (ref 5–15)
BUN: 8 mg/dL (ref 6–20)
CHLORIDE: 112 mmol/L — AB (ref 101–111)
CO2: 22 mmol/L (ref 22–32)
CREATININE: 0.55 mg/dL (ref 0.44–1.00)
Calcium: 7.9 mg/dL — ABNORMAL LOW (ref 8.9–10.3)
GFR calc non Af Amer: >60 mL/min (ref >60)
Glucose, Bld: 99 mg/dL (ref 65–99)
Potassium: 3.1 mmol/L — ABNORMAL LOW (ref 3.5–5.1)
SODIUM: 141 mmol/L (ref 135–145)

## 2017-06-27 LAB — CBC
HEMATOCRIT: 30.7 % — AB (ref 36.0–46.0)
HEMOGLOBIN: 10.8 g/dL — AB (ref 12.0–15.0)
MCH: 30.4 pg (ref 26.0–34.0)
MCHC: 35.2 g/dL (ref 30.0–36.0)
MCV: 86.5 fL (ref 78.0–100.0)
Platelets: 114 10*3/uL — ABNORMAL LOW (ref 150–400)
RBC: 3.55 MIL/uL — ABNORMAL LOW (ref 3.87–5.11)
RDW: 12.8 % (ref 11.5–15.5)
WBC: 6.8 10*3/uL (ref 4.0–10.5)

## 2017-06-27 LAB — ECHOCARDIOGRAM COMPLETE
HEIGHTINCHES: 64 in
WEIGHTICAEL: 2507.95 [oz_av]

## 2017-06-27 LAB — URINE CULTURE: Culture: <10000 — AB

## 2017-06-27 LAB — MAGNESIUM: Magnesium: 1.9 mg/dL (ref 1.7–2.4)

## 2017-06-27 MED ORDER — METOPROLOL TARTRATE 25 MG PO TABS
25.0000 mg | ORAL_TABLET | Freq: Two times a day (BID) | ORAL | Status: DC
  Administered 2017-06-27 (×2): 25 mg via ORAL
  Filled 2017-06-27 (×2): qty 1

## 2017-06-27 MED ORDER — DILTIAZEM HCL-DEXTROSE 100-5 MG/100ML-% IV SOLN (PREMIX)
5.0000 mg/h | INTRAVENOUS | Status: DC
  Administered 2017-06-27 – 2017-06-28 (×2): 5 mg/h via INTRAVENOUS
  Filled 2017-06-27 (×2): qty 100

## 2017-06-27 MED ORDER — POTASSIUM CHLORIDE 20 MEQ/15ML (10%) PO SOLN
40.0000 meq | Freq: Once | ORAL | Status: AC
  Administered 2017-06-27: 40 meq via ORAL
  Filled 2017-06-27: qty 30

## 2017-06-27 MED ORDER — METOPROLOL TARTRATE 5 MG/5ML IV SOLN: 5.0000 mg | Freq: Once | INTRAVENOUS | Status: DC

## 2017-06-27 MED ORDER — METOPROLOL TARTRATE 5 MG/5ML IV SOLN
5.0000 mg | Freq: Four times a day (QID) | INTRAVENOUS | Status: DC | PRN
  Administered 2017-06-27: 5 mg via INTRAVENOUS
  Filled 2017-06-27 (×2): qty 5

## 2017-06-27 NOTE — Progress Notes (Signed)
Subjective:  Amiodarone was turned off last night around 8:00 and she went back into atrial fibrillation with rapid response shortly before 7 AM.  She was just now given metoprolol 5 mg IV and transiently converted to sinus rhythm but is now back in atrial fibrillation.  She complained that when she closed her eyes that she would see dancing colors and lighs even though her eyes were closed and may have been seeing images and was unable to sleep either of the past 2 nights because of this.  No current complaints of chest pain Objective:  Vital Signs in the last 24 hours: BP (!) 141/90   Pulse (!) 133   Temp 97.6 F (36.4 C) (Oral)   Resp 14   Ht 5\' 4"  (1.626 m)   Wt 71.1 kg (156 lb 12 oz)   SpO2 98%   BMI 26.91 kg/m   Physical Exam: Pleasant female currently in no acute distress Lungs:  Clear Cardiac:  Regular rhythm, normal S1 and S2, no S3 Extremities:  No edema present  Intake/Output from previous day: 10/27 0701 - 10/28 0700 In: 2579.7 [I.V.:2579.7] Out: 625 [Urine:625]  Weight Filed Weights   06/25/17 1144 06/26/17 0500 06/27/17 0409  Weight: 68 kg (150 lb) 70.4 kg (155 lb 3.3 oz) 71.1 kg (156 lb 12 oz)    Lab Results: Basic Metabolic Panel:  Recent Labs  06/26/17 0322 06/27/17 0347  NA 139 141  K 3.1* 3.1*  CL 111 112*  CO2 19* 22  GLUCOSE 98 99  BUN 12 8  CREATININE 0.67 0.55   CBC:  Recent Labs  06/25/17 1130  06/26/17 0322 06/27/17 0347  WBC 15.3*  --  10.3 6.8  NEUTROABS 13.9*  --   --   --   HGB 13.7  < > 10.7* 10.8*  HCT 39.0  < > 30.8* 30.7*  MCV 88.6  --  88.5 86.5  PLT 117*  --  106* 114*  < > = values in this interval not displayed. Cardiac Enzymes: Troponin (Point of Care Test)  Recent Labs  06/25/17 1139  TROPIPOC 0.17*   Troponin (Point of Care Test)  Recent Labs  06/25/17 1139  TROPIPOC 0.17*   Cardiac Panel (last 3 results)  Recent Labs  06/26/17 1110 06/26/17 1529 06/26/17 2147  TROPONINI 0.09* 0.08* 0.07*    Telemetry: Currently normal sinus rhythm with PACs  Assessment/Plan:  1.  Paroxysmal atrial fibrillation with recurrence-likely related to recent septic shock 2.  Elevation of troponin normal possibly due to rapid atrial fibrillation and cardioversion 3.  Hypokalemia  Recommendations:  Echocardiogram was not done yet and will ask that it be done today.she needs to have her potassium repleted.  We'll start oral beta blockers and continue IV beta blockers and diltiazem.  If fails to convert to sinus rhythm may need to go back on amiodarone.    Kerry Hough  MD [facility]  06/27/2017, 8:36 AM

## 2017-06-27 NOTE — Progress Notes (Signed)
  Echocardiogram 2D Echocardiogram has been performed.  Darlina Sicilian M 06/27/2017, 11:50 AM

## 2017-06-27 NOTE — Progress Notes (Signed)
Pt in afib rvr hr 140s.  Informed Dr. Doyle Askew.  Metoprolol IV ordered.   Irven [place], RN

## 2017-06-27 NOTE — Progress Notes (Signed)
  Subjective: The patient had an episode of A-fib this morning after stopping amiodarone last night.  Requiring IV diltiazem and metoprolol for rate control.  Denies chest or flank pain this morning.  No voiding complaints.  Objective: Vital signs in last 24 hours: Temp:  [97.6 F (36.4 C)-98.9 F (37.2 C)] 98 F (36.7 C) (10/28 0800) Pulse Rate:  [72-133] 133 (10/28 0800) Resp:  [9-23] 14 (10/28 0800) BP: (116-141)/(58-103) 141/90 (10/28 0800) SpO2:  [92 %-99 %] 98 % (10/28 0800) Weight:  [71.1 kg (156 lb 12 oz)] 71.1 kg (156 lb 12 oz) (10/28 0409)  Intake/Output from previous day: 10/27 0701 - 10/28 0700 In: 2579.7 [I.V.:2579.7] Out: 625 [Urine:625]  Intake/Output this shift: No intake/output data recorded.  Physical Exam:  General: Alert and oriented CV: Irregularly irregular, palpable distal pulses Lungs: CTAB, equal chest rise Abdomen: Soft, NTND, no rebound or guarding Ext: NT, No erythema  Lab Results:  Recent Labs  06/25/17 1201 06/26/17 0322 06/27/17 0347  HGB 13.3 10.7* 10.8*  HCT 39.0 30.8* 30.7*   BMET  Recent Labs  06/26/17 0322 06/27/17 0347  NA 139 141  K 3.1* 3.1*  CL 111 112*  CO2 19* 22  GLUCOSE 98 99  BUN 12 8  CREATININE 0.67 0.55  CALCIUM 7.6* 7.9*     Studies/Results: Dg Chest Port 1 View  Result Date: 06/25/2017 CLINICAL DATA:  Fever and shortness of breath. Recent stone removal 2 days ago. EXAM: PORTABLE CHEST 1 VIEW COMPARISON:  None. FINDINGS: The cardiomediastinal silhouette is normal in size. Normal pulmonary vascularity. No focal consolidation, pleural effusion, or pneumothorax. No acute osseous abnormality. IMPRESSION: No active disease. Electronically Signed   By: Titus Dubin M.D.   On: 06/25/2017 12:29    Assessment/Plan:  POD 4 s/p left ureteroscopy with subsequent urosepsis with septic shock- Improving.  Blood and urine cxs are showing no significant growth at this time.  WBC has normalized and she has remained  afebrile for the past 48 hours.  Currently on IV Rocephin.  Will consider converting to PO abx tomorrow.   Atrial fibrillation with RVR requiring cardioversion on 10/26- Currently on metoprolol and diltiazem for rate control.  Cardiology following.  LOS: 2 days   Ellison Hughs, MD 06/27/2017, 9:47 AM  [facility] Pager: [phone]

## 2017-06-27 NOTE — Progress Notes (Signed)
Patient ID: Veronica Woods, female   DOB: 1955-10-11, 61 y.o.   MRN: [id number]    PROGRESS NOTE  XIMENA TODARO  EHM:[id number] DOB: 07-04-1956 DOA: 06/25/2017  PCP: Horald Pollen, MD   Brief Narrative:  61 y.o. female who underwent cystourethroscopy and stent placement, left ureter on 06/23/2017 (patient with known history of kidney stone and recurrent UTIs), developed fever day after procedure and returned to office today with main concern of progressively worsening lower quadrants abdominal pain and fevers up to 102F. Due to concern for sepsis, she has received dose of Rocephin in the office and was referred to emergency department for further evaluation. In the emergency department she was found to be hypotensive and tachycardic, with heart rate up to 200s, was in atrial fibrillation with RVR which was felt to be due to sepsis. Urgent cardioversion was performed unsuccessfully.cardiology was consulted, recommended amiodarone and patient converted to sinus rhythm. She has received total of 2.5 L of normal saline in ER and has remained hypotensive with systolic blood pressure 36O to 90s. Patient was also given vancomycin in emergency department and [facility] was asked to admit for further evaluation.   Assessment & Plan: Active Problems:   Sepsis ([place]) - suspect urinary source - cont Rocephin day #r - blood cultures with no growth to date - follow up on urine cultures     Acute kidney injury - Suspect prerenal etiology - resolved with IVF     A. Fib with RVR, elevated trop, demand ischemia  - Cardiology consulted - has initially converted to sinus rhythm but this am again back in a-fib - given dose of IV metoprolol and it work shortly but back to a-fib again - per cardiology, keep on oral beta blocker, also IV beta blocker as needed, Cardizem - may need to go back on Amiodarone     Hypokalemia - supplement and repeat BMP in AM - check Mg level     Thrombocytopenia - suspect  reactive - monitor   DVT prophylaxis: Lovenox SQ Code Status: Full  Family Communication: Patient and husband at bedside  Disposition Plan: keep in SDU   Consultants:   Cardiology  Urology   Procedures:   None   Antimicrobials:   Rocephin 10/26 -->  Subjective: Pt with a-fib this AM.  Objective: Vitals:   06/27/17 0800 06/27/17 0900 06/27/17 1000 06/27/17 1100  BP: (!) 141/90 (!) 120/58 116/76 118/81  Pulse: (!) 133 (!) 133 82 86  Resp: 14 17 14 16   Temp: 98 F (36.7 C)     TempSrc: Oral     SpO2: 98% 97% 97% 95%  Weight:      Height:        Intake/Output Summary (Last 24 hours) at 06/27/17 1235 Last data filed at 06/27/17 0656  Gross per 24 hour  Intake          1762.84 ml  Output              625 ml  Net          1137.84 ml   Filed Weights   06/25/17 1144 06/26/17 0500 06/27/17 0409  Weight: 68 kg (150 lb) 70.4 kg (155 lb 3.3 oz) 71.1 kg (156 lb 12 oz)    Physical Exam  Constitutional: Appears well-developed and well-nourished. No distress.  CVS: irregular rate and rhythm, S1/S2 +, no murmurs  Pulmonary: Effort and breath sounds normal, no stridor, rhonchi, wheezes, rales.  Abdominal: Soft. BS +,  no distension, tenderness, rebound or guarding.  Musculoskeletal: Normal range of motion. No edema and no tenderness.  Neuro: Alert. Normal reflexes, muscle tone coordination. No cranial nerve deficit.  Data Reviewed: I have personally reviewed following labs and imaging studies  CBC:  Recent Labs Lab 06/25/17 1130 06/25/17 1201 06/26/17 0322 06/27/17 0347  WBC 15.3*  --  10.3 6.8  NEUTROABS 13.9*  --   --   --   HGB 13.7 13.3 10.7* 10.8*  HCT 39.0 39.0 30.8* 30.7*  MCV 88.6  --  88.5 86.5  PLT 117*  --  106* 182*   Basic Metabolic Panel:  Recent Labs Lab 06/25/17 1130 06/25/17 1201 06/26/17 0322 06/27/17 0347  NA 136 135 139 141  K 3.4* 3.5 3.1* 3.1*  CL 101 102 111 112*  CO2 18*  --  19* 22  GLUCOSE 102* 97 98 99  BUN 20 19 12 8     CREATININE 1.37* 1.20* 0.67 0.55  CALCIUM 8.8*  --  7.6* 7.9*   Liver Function Tests:  Recent Labs Lab 06/25/17 1130  AST 43*  ALT 28  ALKPHOS 150*  BILITOT 2.2*  PROT 6.8  ALBUMIN 3.3*   Coagulation Profile:  Recent Labs Lab 06/25/17 1748  INR 1.04   Cardiac Enzymes:  Recent Labs Lab 06/26/17 1110 06/26/17 1529 06/26/17 2147  TROPONINI 0.09* 0.08* 0.07*   Thyroid Function Tests:  Recent Labs  06/26/17 0322  TSH 1.013   Urine analysis:    Component Value Date/Time   COLORURINE AMBER (A) 06/25/2017 1127   APPEARANCEUR CLOUDY (A) 06/25/2017 1127   LABSPEC 1.015 06/25/2017 1127   PHURINE 5.0 06/25/2017 1127   GLUCOSEU NEGATIVE 06/25/2017 1127   HGBUR LARGE (A) 06/25/2017 1127   BILIRUBINUR NEGATIVE 06/25/2017 1127   BILIRUBINUR negative 06/04/2017 0900   KETONESUR 20 (A) 06/25/2017 1127   PROTEINUR 100 (A) 06/25/2017 1127   UROBILINOGEN 0.2 06/04/2017 0900   NITRITE NEGATIVE 06/25/2017 1127   LEUKOCYTESUR LARGE (A) 06/25/2017 1127   Recent Results (from the past 240 hour(s))  Urine culture     Status: Abnormal   Collection Time: 06/25/17 11:28 AM  Result Value Ref Range Status   Specimen Description URINE, CLEAN CATCH  Final   Special Requests NONE  Final   Culture (A)  Final    <10,000 COLONIES/mL INSIGNIFICANT GROWTH Performed at [facility], [place] [address], [place], [place] [address]    Report Status 06/27/2017 FINAL  Final  Blood Culture (routine x 2)     Status: None (Preliminary result)   Collection Time: 06/25/17 11:36 AM  Result Value Ref Range Status   Specimen Description BLOOD LEFT ANTECUBITAL  Final   Special Requests   Final    BOTTLES DRAWN AEROBIC AND ANAEROBIC Blood Culture adequate volume   Culture   Final    NO GROWTH < 24 HOURS Performed at [facility], [place] [address], [place], [place] [address]    Report Status PENDING  Incomplete  Blood Culture (routine x 2)     Status: None (Preliminary result)    Collection Time: 06/25/17 11:44 AM  Result Value Ref Range Status   Specimen Description BLOOD LEFT HAND  Final   Special Requests   Final    BOTTLES DRAWN AEROBIC AND ANAEROBIC Blood Culture adequate volume   Culture   Final    NO GROWTH < 24 HOURS Performed at [facility], [place] [address], [place],  [address]  Report Status PENDING  Incomplete  MRSA PCR Screening     Status: None   Collection Time: 06/25/17  5:04 PM  Result Value Ref Range Status   MRSA by PCR NEGATIVE NEGATIVE Final    Comment:        The GeneXpert MRSA Assay (FDA approved for NASAL specimens only), is one component of a comprehensive MRSA colonization surveillance program. It is not intended to diagnose MRSA infection nor to guide or monitor treatment for MRSA infections.     Radiology Studies: No results found. Scheduled Meds: . enoxaparin (LOVENOX) injection  40 mg Subcutaneous Q24H  . metoprolol tartrate  5 mg Intravenous Once  . metoprolol tartrate  25 mg Oral BID  . tamsulosin  0.4 mg Oral QPC supper   Continuous Infusions: . sodium chloride 75 mL/hr at 06/27/17 0127  . cefTRIAXone (ROCEPHIN)  IV 1 g (06/26/17 1752)  . diltiazem (CARDIZEM) infusion 5 mg/hr (06/27/17 1020)     LOS: 2 days   Time spent: 25 minutes   Faye Ramsay, MD [facility] Pager [phone]  If 7PM-7AM, please contact night-coverage [email or web address] Password [facility] 06/27/2017, 12:35 PM

## 2017-06-28 LAB — CBC
HEMATOCRIT: 31.7 % — AB (ref 36.0–46.0)
HEMOGLOBIN: 10.9 g/dL — AB (ref 12.0–15.0)
MCH: 30.2 pg (ref 26.0–34.0)
MCHC: 34.4 g/dL (ref 30.0–36.0)
MCV: 87.8 fL (ref 78.0–100.0)
Platelets: 139 10*3/uL — ABNORMAL LOW (ref 150–400)
RBC: 3.61 MIL/uL — ABNORMAL LOW (ref 3.87–5.11)
RDW: 13 % (ref 11.5–15.5)
WBC: 5.4 10*3/uL (ref 4.0–10.5)

## 2017-06-28 LAB — BASIC METABOLIC PANEL
ANION GAP: 8 (ref 5–15)
BUN: 7 mg/dL (ref 6–20)
CALCIUM: 8 mg/dL — AB (ref 8.9–10.3)
CO2: 23 mmol/L (ref 22–32)
Chloride: 109 mmol/L (ref 101–111)
Creatinine, Ser: 0.53 mg/dL (ref 0.44–1.00)
GFR calc Af Amer: >60 mL/min (ref >60)
Glucose, Bld: 103 mg/dL — ABNORMAL HIGH (ref 65–99)
POTASSIUM: 3.3 mmol/L — AB (ref 3.5–5.1)
SODIUM: 140 mmol/L (ref 135–145)

## 2017-06-28 MED ORDER — POTASSIUM CHLORIDE CRYS ER 20 MEQ PO TBCR: 20.0000 meq | EXTENDED_RELEASE_TABLET | Freq: Every day | ORAL | 0 refills | Status: DC

## 2017-06-28 MED ORDER — CIPROFLOXACIN HCL 500 MG PO TABS: 500.0000 mg | ORAL_TABLET | Freq: Two times a day (BID) | ORAL | Status: DC

## 2017-06-28 MED ORDER — METOPROLOL TARTRATE 50 MG PO TABS: 50.0000 mg | ORAL_TABLET | Freq: Two times a day (BID) | ORAL | 1 refills | Status: DC

## 2017-06-28 MED ORDER — CIPROFLOXACIN HCL 500 MG PO TABS: 500.0000 mg | ORAL_TABLET | Freq: Two times a day (BID) | ORAL | 0 refills | Status: DC

## 2017-06-28 MED ORDER — POTASSIUM CHLORIDE CRYS ER 20 MEQ PO TBCR
40.0000 meq | EXTENDED_RELEASE_TABLET | Freq: Two times a day (BID) | ORAL | Status: DC
  Administered 2017-06-28: 40 meq via ORAL
  Filled 2017-06-28: qty 2

## 2017-06-28 MED ORDER — METOPROLOL TARTRATE 25 MG PO TABS
50.0000 mg | ORAL_TABLET | Freq: Two times a day (BID) | ORAL | Status: DC
  Administered 2017-06-28: 50 mg via ORAL
  Filled 2017-06-28: qty 2

## 2017-06-28 NOTE — Progress Notes (Signed)
Progress Note  Patient Name: Veronica Woods Date of Encounter: 06/28/2017  Primary Cardiologist: Dr. Sallyanne Kuster (new this adm)  Subjective   Feeling the best she has in the last week. Appetite getting better. No recent CP, SOB. She was definitely able to feel when she had gone into atrial fib earlier this admission. No palpitations currently (maintaining NSR).  Inpatient Medications    Scheduled Meds: . enoxaparin (LOVENOX) injection  40 mg Subcutaneous Q24H  . metoprolol tartrate  5 mg Intravenous Once  . metoprolol tartrate  25 mg Oral BID  . potassium chloride  40 mEq Oral BID  . tamsulosin  0.4 mg Oral QPC supper   Continuous Infusions: . sodium chloride 75 mL/hr at 06/28/17 0600  . cefTRIAXone (ROCEPHIN)  IV 1 g (06/27/17 1827)  . diltiazem (CARDIZEM) infusion 5 mg/hr (06/28/17 0600)   PRN Meds: HYDROcodone-acetaminophen, metoprolol tartrate, oxybutynin   Vital Signs    Vitals:   06/28/17 0419 06/28/17 0500 06/28/17 0600 06/28/17 0800  BP:  (!) 116/48 137/67   Pulse:  65 65   Resp:  20 15   Temp: 98.1 F (36.7 C)   98.1 F (36.7 C)  TempSrc: Oral   Oral  SpO2:  97% 92%   Weight:  159 lb 6.3 oz (72.3 kg)    Height:        Intake/Output Summary (Last 24 hours) at 06/28/17 0912 Last data filed at 06/28/17 0600  Gross per 24 hour  Intake             1600 ml  Output              500 ml  Net             1100 ml   Filed Weights   06/26/17 0500 06/27/17 0409 06/28/17 0500  Weight: 155 lb 3.3 oz (70.4 kg) 156 lb 12 oz (71.1 kg) 159 lb 6.3 oz (72.3 kg)    Telemetry    NSR most of yesterday from afternoon on, and into today with brief PACs/PVCs  Physical Exam   GEN: No acute distress.  HEENT: Normocephalic, atraumatic, sclera non-icteric. Neck: No JVD or bruits. Cardiac: RRR no murmurs, rubs, or gallops.  Radials/DP/PT 1+ and equal bilaterally.  Respiratory: Clear to auscultation bilaterally. Breathing is unlabored. GI: Soft, nontender, non-distended, BS  +x 4. MS: no deformity. Extremities: No clubbing or cyanosis. No edema. Distal pedal pulses are 2+ and equal bilaterally. Neuro:  AAOx3. Follows commands. Psych:  Responds to questions appropriately with a normal affect.  Labs    Chemistry Recent Labs Lab 06/25/17 1130  06/26/17 0322 06/27/17 0347 06/28/17 0348  NA 136  < > 139 141 140  K 3.4*  < > 3.1* 3.1* 3.3*  CL 101  < > 111 112* 109  CO2 18*  --  19* 22 23  GLUCOSE 102*  < > 98 99 103*  BUN 20  < > 12 8 7   CREATININE 1.37*  < > 0.67 0.55 0.53  CALCIUM 8.8*  --  7.6* 7.9* 8.0*  PROT 6.8  --   --   --   --   ALBUMIN 3.3*  --   --   --   --   AST 43*  --   --   --   --   ALT 28  --   --   --   --   ALKPHOS 150*  --   --   --   --  BILITOT 2.2*  --   --   --   --   GFRNONAA 41*  --  >60 >60 >60  GFRAA 47*  --  >60 >60 >60  ANIONGAP 17*  --  9 7 8   < > = values in this interval not displayed.   Hematology Recent Labs Lab 06/26/17 0322 06/27/17 0347 06/28/17 0348  WBC 10.3 6.8 5.4  RBC 3.48* 3.55* 3.61*  HGB 10.7* 10.8* 10.9*  HCT 30.8* 30.7* 31.7*  MCV 88.5 86.5 87.8  MCH 30.7 30.4 30.2  MCHC 34.7 35.2 34.4  RDW 13.2 12.8 13.0  PLT 106* 114* 139*    Cardiac Enzymes Recent Labs Lab 06/26/17 1110 06/26/17 1529 06/26/17 2147  TROPONINI 0.09* 0.08* 0.07*    Recent Labs Lab 06/25/17 1139  TROPIPOC 0.17*     BNPNo results for input(s): BNP, PROBNP in the last 168 hours.   DDimer No results for input(s): DDIMER in the last 168 hours.   Radiology    No results found.  Cardiac Studies    2D echo 06/27/17: EF 50-55%, no RWMA, mild MR, mildly increased PASP 53mmHg.   Patient Profile     61 y.o. female with recurrent UTIs but otherwise no significant PMH who was admitted with fever/sepsis, hematuria, flank pain following ureteral stent placement for a kidney stone associated with recurrent UTIs. She was also noted to be in atrial fib RVR and hypotensive requiring aggressive fluid resuscitation. EDP  attempted DCCV without success; patient was started on IV amiodarone with conversion to NSR but then reversion back on 10/28. CHADSVASC only 1 for female.    Assessment & Plan    1. Sepsis presumed due to UTI with elevated procalcitonin - per IM.  2. Paroxysmal atrial fib with RVR - maintaining NSR on Lopressor 25mg  BID and diltiazem 5mg /hour. Suspect she's nearing discharge so will discuss med consolidation with Dr. Radford Pax (i.e. Switch diltiazem to oral form, or turn IV dilt off and just increase metoprolol). CHADSVASC only 1 for female so not felt to require anticoagulation at this time. Will need to follow closely for especially when she turns 65 in a few years. Dr. Sallyanne Kuster suggested possible event monitor at discharge although would not presently change management regarding anticoagulation; patient states she was absolutely able to feel when she went in and out of this rhythm so it may be reasonable to monitor this way.  3. Elevated troponins 0/17-0.09-0.08-0.07 - no recent anginal symptoms. LVEF low-normal on echo. Can follow as OP. Will discuss aspirin rx with MD.  4. Hypokalemia - being repleted by IM.  5. Mild anemia/thrombocytopenia - internal medicine suspects reactive in nature; being monitored.  Signed, Melina Copa PA-C (pager [phone]) 06/28/2017, 9:12 AM

## 2017-06-28 NOTE — Progress Notes (Signed)
F/u arranged 11/16, placed on AVS. Tammra Pressman PA-C

## 2017-06-28 NOTE — Progress Notes (Signed)
Patient discharged home. Discharge AVS reviewed with patient. Patient states understanding of instructions, medications and follow up appointments without any further questions or concerns. Patient informed that prescriptions called into [facility]. All belongings taken home by patient.

## 2017-06-28 NOTE — Discharge Summary (Addendum)
Physician Discharge Summary  Veronica Woods WVP:[id number] DOB: December 26, 1955 DOA: 06/25/2017  PCP: Horald Pollen, MD  Admit date: 06/25/2017 Discharge date: 06/28/2017  Recommendations for Outpatient Follow-up:  1. Pt will need to follow up with PCP in 1-2 weeks post discharge 2. Please obtain BMP to evaluate electrolytes and kidney function 3. Complete therapy with Cipro  4. Per cardiology, pt started on Metoprolol   Discharge Diagnoses:  Active Problems:   Sepsis [facility])  Discharge Condition: Stable  Diet recommendation: Heart healthy diet discussed in details   History of present illness:  61 y.o.femalewho underwent cystourethroscopy and stent placement, left ureter on 06/23/2017 (patient with known history of kidney stone and recurrent UTIs), developed fever day after procedure and returned to office today with main concern of progressively worsening lower quadrants abdominal pain and fevers up to 102F. Due to concern for sepsis, she has received dose of Rocephin in the office and was referred to emergency department for further evaluation. In the emergency department she was found to be hypotensive and tachycardic, with heart rate up to 200s, was in atrial fibrillation with RVR which was felt to be due to sepsis. Urgent cardioversion was performed unsuccessfully.cardiology was consulted, recommended amiodarone and patient converted to sinus rhythm. She has received total of 2.5 L of normal saline in ER and has remained hypotensive with systolic blood pressure 54O to 90s. Patient was also given vancomycin in emergency department and [facility] was asked to admit for further evaluation.   Assessment & Plan: Active Problems: Sepsis ([place]) - secondary to UTI but clinically undetermined if related to ureteral stent  - continue rocephin, now change to Cipro on discharge  - blood cultures with no growth to date  Acute kidney injury - Suspect prerenal etiology - resolved   A.  Fib with RVR, elevated trop, demand ischemia  - Cardiology consulted - has initially converted to sinus rhythm but this am again back in a-fib - given dose of IV metoprolol and it work shortly but back to a-fib again - per cardiology, started Metoprolol and continue on discharge    Hypokalemia - supplemented     Thrombocytopenia - suspect reactive  DVT prophylaxis: Lovenox SQ Code Status: Full  Family Communication: Patient and husband at bedside  Disposition Plan: home   Consultants:   Cardiology  Urology   Procedures:   None   Antimicrobials:   Rocephin 10/26 --> changed to Cipro on discharge   Procedures/Studies: Ct Abdomen Pelvis Wo Contrast  Result Date: 06/04/2017 CLINICAL DATA:  Left flank pain and hematuria for several days. EXAM: CT ABDOMEN AND PELVIS WITHOUT CONTRAST TECHNIQUE: Multidetector CT imaging of the abdomen and pelvis was performed following the standard protocol without IV contrast. COMPARISON:  None. FINDINGS: Lower chest: 6 mm left lower lobe pulmonary nodule. Patchy density at the posterior left lung base on image 23. Hepatobiliary: Unremarkable Pancreas: Unremarkable Spleen: Unremarkable Adrenals/Urinary Tract: There are no ureteral calculi. There is no hydronephrosis. Renal pelvises are mildly prominent bilaterally. There are no right renal calculi. There is a tiny calculus in the lower pole of the left kidney. There is also a 8 mm calculus in the dependent portion of the left renal pelvis. Urothelium of the left renal pelvis is thickened. Bladder is decompressed. Stomach/Bowel: Moderate-sized hiatal hernia. No obvious mass in the colon. Minimal diverticulosis of the sigmoid colon. Appendix is decompressed. There is an appendicolith at the tip of the appendix. There is no evidence of acute appendicitis. Vascular/Lymphatic: No abnormal  retroperitoneal adenopathy. No evidence of aortic aneurysm Reproductive: Uterus and adnexa are within normal limits.  Other: No free-fluid. Musculoskeletal: No vertebral compression deformity. There is moderate narrowing of the L4-5 and L5-S1 discs. There is vacuum phenomenon at the L5-S1 disc. IMPRESSION: There is an 8 mm calculus layering in the left renal pelvis associated with urothelial thickening. The calculus may intermittently obstruct at the ureteropelvic junction or cause an inflammatory process along the adjacent urothelium. Tiny calculus in the lower pole of the left kidney. No definite ureteral calculus.  No overt hydronephrosis. Hiatal hernia. Electronically Signed   By: Marybelle Killings M.D.   On: 06/04/2017 11:04   Dg Chest Port 1 View  Result Date: 06/25/2017 CLINICAL DATA:  Fever and shortness of breath. Recent stone removal 2 days ago. EXAM: PORTABLE CHEST 1 VIEW COMPARISON:  None. FINDINGS: The cardiomediastinal silhouette is normal in size. Normal pulmonary vascularity. No focal consolidation, pleural effusion, or pneumothorax. No acute osseous abnormality. IMPRESSION: No active disease. Electronically Signed   By: Titus Dubin M.D.   On: 06/25/2017 12:29     Discharge Exam: Vitals:   06/28/17 1100 06/28/17 1200  BP: 140/82   Pulse: 79   Resp: 16   Temp:  97.7 F (36.5 C)  SpO2: 97%    Vitals:   06/28/17 0900 06/28/17 1000 06/28/17 1100 06/28/17 1200  BP: 121/77 136/80 140/82   Pulse: 66 78 79   Resp: 17 19 16    Temp:    97.7 F (36.5 C)  TempSrc:    Oral  SpO2: 94% 96% 97%   Weight:      Height:        General: Pt is alert, follows commands appropriately, not in acute distress Cardiovascular: Regular rate and rhythm, S1/S2 +, no murmurs, no rubs, no gallops Respiratory: Clear to auscultation bilaterally, no wheezing, no crackles, no rhonchi Abdominal: Soft, non tender, non distended, bowel sounds +, no guarding Extremities: no edema, no cyanosis, pulses palpable bilaterally DP and PT Neuro: Grossly nonfocal  Discharge Instructions  Discharge Instructions    Diet - low  sodium heart healthy    Complete by:  As directed    Increase activity slowly    Complete by:  As directed      Allergies as of 06/28/2017      Reactions   Sulfur Anaphylaxis, Other (See Comments)   Childhood reaction      Medication List    TAKE these medications   ciprofloxacin 500 MG tablet Commonly known as:  CIPRO Take 1 tablet (500 mg total) by mouth 2 (two) times daily.   HYDROcodone-acetaminophen 5-325 MG tablet Commonly known as:  NORCO/VICODIN Take 1 tablet by mouth every 4 (four) hours as needed (For pain.).   metoprolol tartrate 50 MG tablet Commonly known as:  LOPRESSOR Take 1 tablet (50 mg total) by mouth 2 (two) times daily.   oxybutynin 5 MG tablet Commonly known as:  DITROPAN Take 5 mg by mouth every 8 (eight) hours as needed for bladder spasms.   potassium chloride SA 20 MEQ tablet Commonly known as:  K-DUR,KLOR-CON Take 1 tablet (20 mEq total) by mouth daily.   tamsulosin 0.4 MG Caps capsule Commonly known as:  FLOMAX Take 0.4 mg by mouth daily after supper.      Follow-up Information    Erlene Quan, PA-C Follow up.   Specialties:  Cardiology, Radiology Why:  [facility] - 07/16/17 at 9:30am. Please arrive 15 minutes prior to appointment time  to register. Lurena Joiner is one of the PAs that works with the cardiology team. Contact information: [place] [address] [phone]        Horald Pollen, MD Follow up.   Specialty:  Internal Medicine Contact information: [place] [address] [phone]        Theodis Blaze, MD Follow up.   Specialty:  Internal Medicine Why:  please call my cell phone with any questions [phone] Contact information: [address] [place] [address] [phone]            The results of significant diagnostics from this hospitalization (including imaging, microbiology, ancillary and laboratory) are listed below for  reference.     Microbiology: Recent Results (from the past 240 hour(s))  Urine culture     Status: Abnormal   Collection Time: 06/25/17 11:28 AM  Result Value Ref Range Status   Specimen Description URINE, CLEAN CATCH  Final   Special Requests NONE  Final   Culture (A)  Final    <10,000 COLONIES/mL INSIGNIFICANT GROWTH Performed at [facility], [address], [place], [place] [address]    Report Status 06/27/2017 FINAL  Final  Blood Culture (routine x 2)     Status: None (Preliminary result)   Collection Time: 06/25/17 11:36 AM  Result Value Ref Range Status   Specimen Description BLOOD LEFT ANTECUBITAL  Final   Special Requests   Final    BOTTLES DRAWN AEROBIC AND ANAEROBIC Blood Culture adequate volume   Culture   Final    NO GROWTH 2 DAYS Performed at [facility], [place] [address] [place], [place] [address]    Report Status PENDING  Incomplete  Blood Culture (routine x 2)     Status: None (Preliminary result)   Collection Time: 06/25/17 11:44 AM  Result Value Ref Range Status   Specimen Description BLOOD LEFT HAND  Final   Special Requests   Final    BOTTLES DRAWN AEROBIC AND ANAEROBIC Blood Culture adequate volume   Culture   Final    NO GROWTH 2 DAYS Performed at [facility], [place] [address], [place],  [address]    Report Status PENDING  Incomplete  MRSA PCR Screening     Status: None   Collection Time: 06/25/17  5:04 PM  Result Value Ref Range Status   MRSA by PCR NEGATIVE NEGATIVE Final    Comment:        The GeneXpert MRSA Assay (FDA approved for NASAL specimens only), is one component of a comprehensive MRSA colonization surveillance program. It is not intended to diagnose MRSA infection nor to guide or monitor treatment for MRSA infections.      Labs: Basic Metabolic Panel:  Recent Labs Lab 06/25/17 1130 06/25/17 1201 06/26/17 0322 06/27/17 0347 06/28/17 0348  NA 136 135 139 141 140  K 3.4* 3.5 3.1* 3.1* 3.3*  CL  101 102 111 112* 109  CO2 18*  --  19* 22 23  GLUCOSE 102* 97 98 99 103*  BUN 20 19 12 8 7   CREATININE 1.37* 1.20* 0.67 0.55 0.53  CALCIUM 8.8*  --  7.6* 7.9* 8.0*  MG  --   --   --  1.9  --    Liver Function Tests:  Recent Labs Lab 06/25/17 1130  AST 43*  ALT 28  ALKPHOS 150*  BILITOT 2.2*  PROT 6.8  ALBUMIN 3.3*   No  results for input(s): LIPASE, AMYLASE in the last 168 hours. No results for input(s): AMMONIA in the last 168 hours. CBC:  Recent Labs Lab 06/25/17 1130 06/25/17 1201 06/26/17 0322 06/27/17 0347 06/28/17 0348  WBC 15.3*  --  10.3 6.8 5.4  NEUTROABS 13.9*  --   --   --   --   HGB 13.7 13.3 10.7* 10.8* 10.9*  HCT 39.0 39.0 30.8* 30.7* 31.7*  MCV 88.6  --  88.5 86.5 87.8  PLT 117*  --  106* 114* 139*   Cardiac Enzymes:  Recent Labs Lab 06/26/17 1110 06/26/17 1529 06/26/17 2147  TROPONINI 0.09* 0.08* 0.07*    SIGNED Time coordinating discharge: 60 minutes  Faye Ramsay, MD  [facility] 06/28/2017, 12:24 PM Pager [phone]  If 7PM-7AM, please contact night-coverage [email or web address] Password [email or web address]

## 2017-06-28 NOTE — Care Management Note (Signed)
Case Management Note  Patient Details  Name: Veronica Woods MRN: [id number] Date of Birth: 1956-06-06  Subjective/Objective:                  Hypotension and a.fib with iv cardizem, drip  Action/Plan: Date:  June 28, 2017 Chart reviewed for concurrent status and case management needs.  Will continue to follow patient progress.  Discharge Planning: following for needs  Expected discharge date: [id number]  Velva Harman, BSN, [place], [place] Expected Discharge Date:   (unknown)               Expected Discharge Plan:  Home/Self Care  In-House Referral:     Discharge planning Services  CM Consult  Post Acute Care Choice:    Choice offered to:     DME Arranged:    DME Agency:     HH Arranged:    [place] Agency:     Status of Service:  In process, will continue to follow  If discussed at Long Length of Stay Meetings, dates discussed:    Additional Comments:  Leeroy Cha, RN 06/28/2017, 9:00 AM

## 2017-06-28 NOTE — Discharge Instructions (Signed)
Atrial Fibrillation Atrial fibrillation is a type of irregular or rapid heartbeat (arrhythmia). In atrial fibrillation, the heart quivers continuously in a chaotic pattern. This occurs when parts of the heart receive disorganized signals that make the heart unable to pump blood normally. This can increase the risk for stroke, heart failure, and other heart-related conditions. There are different types of atrial fibrillation, including:  Paroxysmal atrial fibrillation. This type starts suddenly, and it usually stops on its own shortly after it starts.  Persistent atrial fibrillation. This type often lasts longer than a week. It may stop on its own or with treatment.  Long-lasting persistent atrial fibrillation. This type lasts longer than 12 months.  Permanent atrial fibrillation. This type does not go away.  Talk with your health care provider to learn about the type of atrial fibrillation that you have. What are the causes? This condition is caused by some heart-related conditions or procedures, including:  A heart attack.  Coronary artery disease.  Heart failure.  Heart valve conditions.  High blood pressure.  Inflammation of the sac that surrounds the heart (pericarditis).  Heart surgery.  Certain heart rhythm disorders, such as Wolf-Parkinson-White syndrome.  Other causes include:  Pneumonia.  Obstructive sleep apnea.  Blockage of an artery in the lungs (pulmonary embolism, or PE).  Lung cancer.  Chronic lung disease.  Thyroid problems, especially if the thyroid is overactive (hyperthyroidism).  Caffeine.  Excessive alcohol use or illegal drug use.  Use of some medicines, including certain decongestants and diet pills.  Sometimes, the cause cannot be found. What increases the risk? This condition is more likely to develop in:  People who are older in age.  People who smoke.  People who have diabetes mellitus.  People who are overweight  (obese).  Athletes who exercise vigorously.  What are the signs or symptoms? Symptoms of this condition include:  A feeling that your heart is beating rapidly or irregularly.  A feeling of discomfort or pain in your chest.  Shortness of breath.  Sudden light-headedness or weakness.  Getting tired easily during exercise.  In some cases, there are no symptoms. How is this diagnosed? Your health care provider may be able to detect atrial fibrillation when taking your pulse. If detected, this condition may be diagnosed with:  An electrocardiogram (ECG).  A Holter monitor test that records your heartbeat patterns over a 24-hour period.  Transthoracic echocardiogram (TTE) to evaluate how blood flows through your heart.  Transesophageal echocardiogram (TEE) to view more detailed images of your heart.  A stress test.  Imaging tests, such as a CT scan or chest X-ray.  Blood tests.  How is this treated? The main goals of treatment are to prevent blood clots from forming and to keep your heart beating at a normal rate and rhythm. The type of treatment that you receive depends on many factors, such as your underlying medical conditions and how you feel when you are experiencing atrial fibrillation. This condition may be treated with:  Medicine to slow down the heart rate, bring the heart's rhythm back to normal, or prevent clots from forming.  Electrical cardioversion. This is a procedure that resets your heart's rhythm by delivering a controlled, low-energy shock to the heart through your skin.  Different types of ablation, such as catheter ablation, catheter ablation with pacemaker, or surgical ablation. These procedures destroy the heart tissues that send abnormal signals. When the pacemaker is used, it is placed under your skin to help your heart beat in   a regular rhythm.  Follow these instructions at home:  Take over-the counter and prescription medicines only as told by your  health care provider.  If your health care provider prescribed a blood-thinning medicine (anticoagulant), take it exactly as told. Taking too much blood-thinning medicine can cause bleeding. If you do not take enough blood-thinning medicine, you will not have the protection that you need against stroke and other problems.  Do not use tobacco products, including cigarettes, chewing tobacco, and e-cigarettes. If you need help quitting, ask your health care provider.  If you have obstructive sleep apnea, manage your condition as told by your health care provider.  Do not drink alcohol.  Do not drink beverages that contain caffeine, such as coffee, soda, and tea.  Maintain a healthy weight. Do not use diet pills unless your health care provider approves. Diet pills may make heart problems worse.  Follow diet instructions as told by your health care provider.  Exercise regularly as told by your health care provider.  Keep all follow-up visits as told by your health care provider. This is important. How is this prevented?  Avoid drinking beverages that contain caffeine or alcohol.  Avoid certain medicines, especially medicines that are used for breathing problems.  Avoid certain herbs and herbal medicines, such as those that contain ephedra or ginseng.  Do not use illegal drugs, such as cocaine and amphetamines.  Do not smoke.  Manage your high blood pressure. Contact a health care provider if:  You notice a change in the rate, rhythm, or strength of your heartbeat.  You are taking an anticoagulant and you notice increased bruising.  You tire more easily when you exercise or exert yourself. Get help right away if:  You have chest pain, abdominal pain, sweating, or weakness.  You feel nauseous.  You notice blood in your vomit, bowel movement, or urine.  You have shortness of breath.  You suddenly have swollen feet and ankles.  You feel dizzy.  You have sudden weakness or  numbness of the face, arm, or leg, especially on one side of the body.  You have trouble speaking, trouble understanding, or both (aphasia).  Your face or your eyelid droops on one side. These symptoms may represent a serious problem that is an emergency. Do not wait to see if the symptoms will go away. Get medical help right away. Call your local emergency services (911 in the [place] Do not drive yourself to the hospital. This information is not intended to replace advice given to you by your health care provider. Make sure you discuss any questions you have with your health care provider. Document Released: 08/17/2005 Document Revised: 12/25/2015 Document Reviewed: 12/12/2014 Elsevier Interactive Patient Education  2017 [employer]vier Inc.  

## 2017-06-30 LAB — CULTURE, BLOOD (ROUTINE X 2)
CULTURE: NO GROWTH
CULTURE: NO GROWTH
Special Requests: ADEQUATE
Special Requests: ADEQUATE

## 2017-07-01 ENCOUNTER — Telehealth: Payer: Self-pay | Admitting: Cardiology

## 2017-07-01 NOTE — Telephone Encounter (Signed)
Please call,woke up this morning with Atrial Fib. Now off and on she feels a little burning in the bottom of her heart and just feels uncomfortable in her chest.Please call to advise,she says all this is new to her. ple

## 2017-07-01 NOTE — Telephone Encounter (Signed)
Returned call to patient she stated she had another episode of Afib and burning sensation in chest this morning.Stated lasted appox 4 hours.Stated she feels good now, no afib,no burning sensation.Stated she has appointment 07/16/17,but would like to be seen sooner.Appointment moved up to 07/05/17 at 11:00 am with [facility] PA.Advised to go to ED if she has another episode.

## 2017-07-05 ENCOUNTER — Ambulatory Visit (INDEPENDENT_AMBULATORY_CARE_PROVIDER_SITE_OTHER): Payer: BC Managed Care – PPO | Admitting: Cardiology

## 2017-07-05 ENCOUNTER — Encounter: Payer: Self-pay | Admitting: Cardiology

## 2017-07-05 VITALS — BP 120/84 | HR 76 | Ht 64.0 in | Wt 145.2 lb

## 2017-07-05 DIAGNOSIS — I4891 Unspecified atrial fibrillation: Secondary | ICD-10-CM

## 2017-07-05 MED ORDER — METOPROLOL TARTRATE 50 MG PO TABS: 50.0000 mg | ORAL_TABLET | Freq: Two times a day (BID) | ORAL | 3 refills | Status: DC

## 2017-07-05 NOTE — Assessment & Plan Note (Signed)
Pt had PAF in the setting of urosepsis after a ureteral stent was placed

## 2017-07-05 NOTE — Progress Notes (Signed)
07/05/2017 Veronica Woods   05-07-1956  [id number]  Primary Physician Sagardia, Ines Bloomer, MD Primary Cardiologist: Dr Sallyanne Kuster  HPI:  61 y.o. female without previous medical problems who was seen 06/25/17 for the evaluation of atrial fibrillation with rapid ventricular response. The pt was found to be hypotensive and extremely tachycardic due to atrial fibrillation with rapid ventricular response.  She was also felt to be septic s/p recent ureteral stent placement.  She received antibiotics and a urgent cardioversion was performed unsuccessfully. She was then placed on intravenous amiodarone and she has converted to normal sinus rhythm. Echo showed normal LVF with LA size. TSH was WNL. She was discharged 06/28/17 and is seen today in the office for follow up. She has noted a rare palpitation but no sustained tachycardia. She did call 11/1 and asked that her appointment be moved up secondary to an episode of chest discomfort. She notes she still feels fatigued and has noticed brief, localized, left sided chest pain, no exertional chest tightness or dyspnea.      Current Outpatient Medications  Medication Sig Dispense Refill  . ciprofloxacin (CIPRO) 500 MG tablet Take 1 tablet (500 mg total) by mouth 2 (two) times daily. 20 tablet 0  . HYDROcodone-acetaminophen (NORCO/VICODIN) 5-325 MG tablet Take 1 tablet by mouth every 4 (four) hours as needed (For pain.).     Marland Kitchen metoprolol tartrate (LOPRESSOR) 50 MG tablet Take 1 tablet (50 mg total) 2 (two) times daily by mouth. 180 tablet 3  . oxybutynin (DITROPAN) 5 MG tablet Take 5 mg by mouth every 8 (eight) hours as needed for bladder spasms.     . potassium chloride SA (K-DUR,KLOR-CON) 20 MEQ tablet Take 1 tablet (20 mEq total) by mouth daily. 5 tablet 0  . tamsulosin (FLOMAX) 0.4 MG CAPS capsule Take 0.4 mg by mouth daily after supper.      No current facility-administered medications for this visit.     Allergies  Allergen Reactions  . Sulfur  Anaphylaxis and Other (See Comments)    Childhood reaction    No past medical history on file.  Social History   Socioeconomic History  . Marital status: Married    Spouse name: Not on file  . Number of children: Not on file  . Years of education: Not on file  . Highest education level: Not on file  Social Needs  . Financial resource strain: Not on file  . Food insecurity - worry: Not on file  . Food insecurity - inability: Not on file  . Transportation needs - medical: Not on file  . Transportation needs - non-medical: Not on file  Occupational History  . Not on file  Tobacco Use  . Smoking status: Never Smoker  . Smokeless tobacco: Never Used  Substance and Sexual Activity  . Alcohol use: No    Alcohol/week: 0.0 oz  . Drug use: No  . Sexual activity: Not on file  Other Topics Concern  . Not on file  Social History Narrative  . Not on file     Family History  Problem Relation Age of Onset  . Cancer Mother   . Heart disease Mother   . Hyperlipidemia Mother   . Hypertension Mother   . Stroke Mother   . Cancer Father      Review of Systems: General: negative for chills, fever, night sweats or weight changes.  Cardiovascular: negative for chest pain, dyspnea on exertion, edema, orthopnea, palpitations, paroxysmal nocturnal dyspnea or shortness of  breath Dermatological: negative for rash Respiratory: negative for cough or wheezing Urologic: negative for hematuria Abdominal: negative for nausea, vomiting, diarrhea, bright red blood per rectum, melena, or hematemesis Neurologic: negative for visual changes, syncope, or dizziness All other systems reviewed and are otherwise negative except as noted above.    Blood pressure 120/84, pulse 76, height 5\' 4"  (1.626 m), weight 145 lb 3.2 oz (65.9 kg).  General appearance: alert, cooperative and no distress Lungs: clear to auscultation bilaterally Heart: regular rate and rhythm  EKG NSR  ASSESSMENT AND PLAN:    Atrial fibrillation with rapid ventricular response (HCC) Pt had PAF in the setting of urosepsis after a ureteral stent was placed   PLAN  Same Rx- continue Lopressor 50 mg BID. She is a CHADs VASc=1 and it was decided not to anticoagulate. She has had some "flashing lights" noted in her right eye she feels is secondary to a retinal problem she has. I urged her to f/u with her eye/ retina physician for an exam. If she had embolic retinal embolism noted that would change her CADs VASc score. F/U with Dr Sallyanne Kuster in 3 months. Consider treadmill or Lexiscan then if she continues to complain of chest pains. I did not want to proceed with this now since she is still recovering from her sepsis episode. They know to contact us if she has recurrent tachycardia or exertion chest discomfort.   Kerin Ransom PA-Woods 07/05/2017 11:22 AM

## 2017-07-05 NOTE — Patient Instructions (Addendum)
Medication Instructions:  Your physician recommends that you continue on your current medications as directed. Please refer to the Current Medication list given to you today.  Labwork: None ordered  Testing/Procedures: None ordered  Follow-Up: Your physician recommends that you schedule a follow-up appointment in: [place]   Any Other Special Instructions Will Be Listed Below (If Applicable).     If you need a refill on your cardiac medications before your next appointment, please call your pharmacy.

## 2017-07-16 ENCOUNTER — Ambulatory Visit: Payer: BC Managed Care – PPO | Admitting: Cardiology

## 2017-10-07 ENCOUNTER — Ambulatory Visit: Payer: BC Managed Care – PPO | Admitting: Cardiovascular Disease

## 2017-10-07 ENCOUNTER — Encounter: Payer: Self-pay | Admitting: Cardiovascular Disease

## 2017-10-07 VITALS — BP 128/86 | HR 76 | Ht 64.0 in | Wt 153.6 lb

## 2017-10-07 DIAGNOSIS — I480 Paroxysmal atrial fibrillation: Secondary | ICD-10-CM | POA: Diagnosis not present

## 2017-10-07 NOTE — Progress Notes (Signed)
Cardiology Office Note:    Date:  10/07/2017   ID:  KIELEE CARE, DOB 1956-02-13, MRN [id number]  PCP:  Horald Pollen, MD  Cardiologist:  Sanda Klein, MD   Referring MD: Horald Pollen, *   Chief Complaint  Patient presents with  . Follow-up    pt reports no complaints  atrial fibrillation   History of Present Illness:    Veronica Woods is a 62 y.o. female with a hx of persistent atrial fibrillation that occurred during an episode of urosepsis.  He did experience palpitations during that episode.  She required cardioversion, but has not had any recurrence of the arrhythmia since.  She does not have a history of stroke/TIA, diabetes, heart failure, coronary disease or hypertension.  She has been taking metoprolol since the episode of arrhythmia.  Echocardiogram showed normal left ventricular systolic function, normal left atrial size and no serious valvular abnormalities.  Her mother passed away shortly following her hospitalization with urosepsis and atrial fibrillation. She is still dealing with the difficulties surrounding her mother's estate, and especially the challenging emotional response of her sister.  Current Medications: Current Meds  Medication Sig  . metoprolol tartrate (LOPRESSOR) 50 MG tablet Take 1 tablet (50 mg total) 2 (two) times daily by mouth.     Allergies:   Sulfur   Social History   Socioeconomic History  . Marital status: Married    Spouse name: None  . Number of children: None  . Years of education: None  . Highest education level: None  Social Needs  . Financial resource strain: None  . Food insecurity - worry: None  . Food insecurity - inability: None  . Transportation needs - medical: None  . Transportation needs - non-medical: None  Occupational History  . None  Tobacco Use  . Smoking status: Never Smoker  . Smokeless tobacco: Never Used  Substance and Sexual Activity  . Alcohol use: No    Alcohol/week: 0.0 oz  . Drug  use: No  . Sexual activity: None  Other Topics Concern  . None  Social History Narrative  . None     Family History: The patient's family history includes Cancer in her father and mother; Heart disease in her mother; Hyperlipidemia in her mother; Hypertension in her mother; Stroke in her mother.  ROS:   Please see the history of present illness.     All other systems reviewed and are negative.  EKGs/Labs/Other Studies Reviewed:    We did not do an EKG heart rateEKG:  EKG is not ordered today.   Recent Labs: 06/25/2017: ALT 28 06/26/2017: TSH 1.013 06/27/2017: Magnesium 1.9 06/28/2017: BUN 7; Creatinine, Ser 0.53; Hemoglobin 10.9; Platelets 139; Potassium 3.3; Sodium 140  Recent Lipid Panel No results found for: CHOL, TRIG, HDL, CHOLHDL, VLDL, LDLCALC, LDLDIRECT  Physical Exam:    VS:  BP 128/86   Pulse 76   Ht 5\' 4"  (1.626 m)   Wt 153 lb 9.6 oz (69.7 kg)   BMI 26.37 kg/m      Wt Readings from Last 3 Encounters:  10/07/17 153 lb 9.6 oz (69.7 kg)  07/05/17 145 lb 3.2 oz (65.9 kg)  06/28/17 159 lb 6.3 oz (72.3 kg)     GEN:  Well nourished, well developed in no acute distress HEENT: Normal NECK: No JVD; No carotid bruits LYMPHATICS: No lymphadenopathy CARDIAC: RRR, no murmurs, rubs, gallops RESPIRATORY:  Clear to auscultation without rales, wheezing or rhonchi  ABDOMEN: Soft, non-tender, non-distended  MUSCULOSKELETAL:  No edema; No deformity  SKIN: Warm and dry NEUROLOGIC:  Alert and oriented x 3 PSYCHIATRIC:  Normal affect   ASSESSMENT:    No diagnosis found. PLAN:    In order of problems listed above:  1. AFIB: Veronica Woods had a single episode of symptomatic atrial fibrillation during critical illness.  She does not have any risk factors for recurrent arrhythmia or for embolic events.  Both her mother and sister have had atrial fibrillation.  There is no evidence of structural heart disease on echocardiography she does not have any cardiovascular complaints.  I  have suggested that she should gradually wean off the metoprolol over the next 2-3 weeks, call us if there is any recurrence of palpitations.  She does not meet criteria for anticoagulation.  We will monitor for arrhythmia recurrence on a yearly basis.   Medication Adjustments/Labs and Tests Ordered: Current medicines are reviewed at length with the patient today.  Concerns regarding medicines are outlined above.  No orders of the defined types were placed in this encounter.  No orders of the defined types were placed in this encounter.   Signed, Sanda Klein, MD  10/07/2017 9:21 AM    [facility]

## 2017-10-07 NOTE — Patient Instructions (Addendum)
Dr Croitoru recommends that you schedule a follow-up appointment in 12 months. You will receive a reminder letter in the mail two months in advance. If you don't receive a letter, please call our office to schedule the follow-up appointment.  If you need a refill on your cardiac medications before your next appointment, please call your pharmacy. 

## 2018-12-20 IMAGING — CT CT ABD-PELV W/O CM
2 of 4 series · 16 of 46 positions shown, 18 images · non-contrast
Comparison: None.

CLINICAL DATA: Left flank pain and hematuria for several days.

EXAM:
CT ABDOMEN AND PELVIS WITHOUT CONTRAST
TECHNIQUE: Multidetector CT imaging of the abdomen and pelvis was performed
following the standard protocol without IV contrast.

[Series 2: axial st · axial · 0.78mm/px · z∈[+1116,+1502]mm · 13 of 87 slices shown, 15 images]
[im 5/87  soft-tissue]
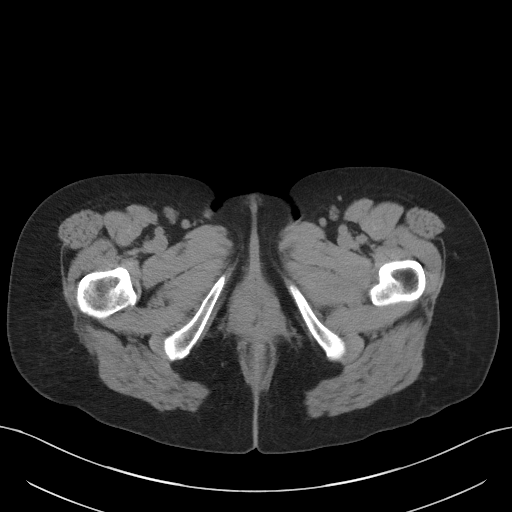
[im 5/87  bone]
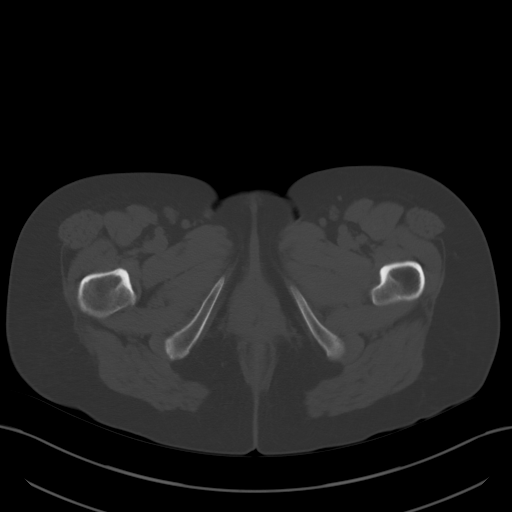
[im 10/87  soft-tissue]
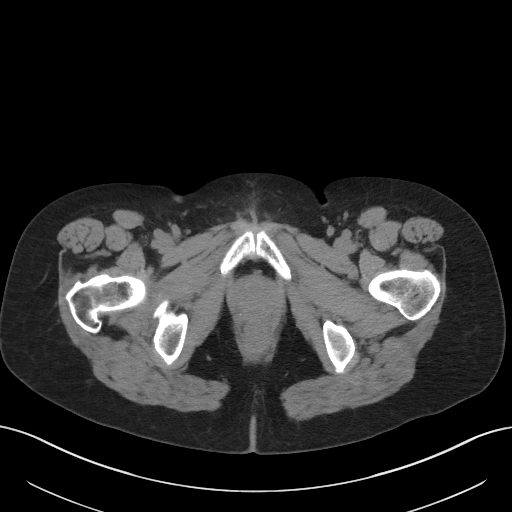
[im 20/87  soft-tissue]
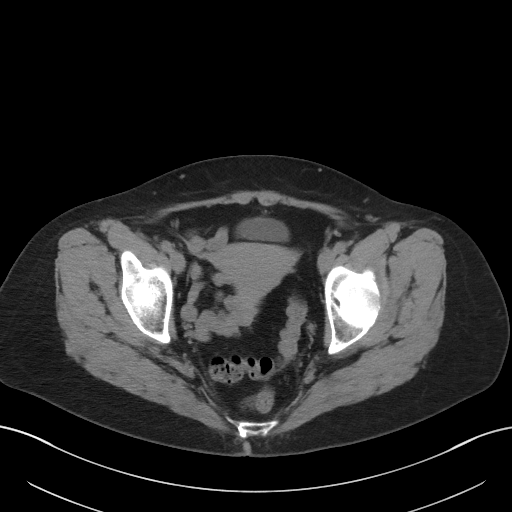
[im 24/87  soft-tissue]
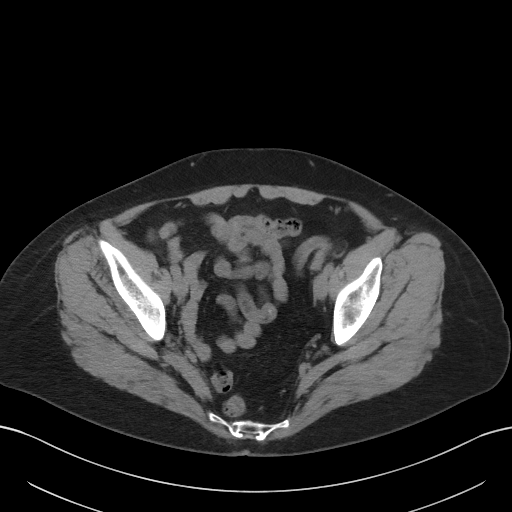
[im 29/87  soft-tissue]
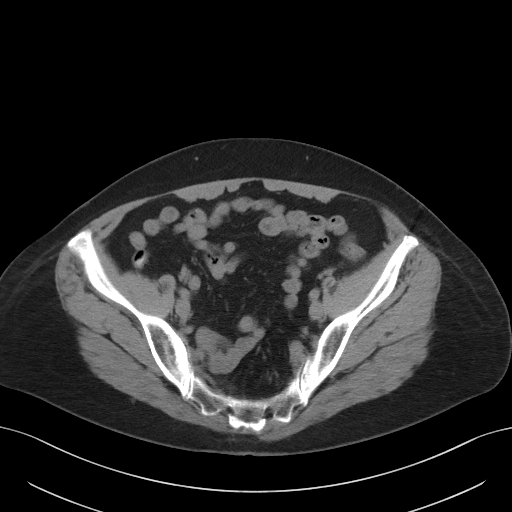
[im 39/87  soft-tissue]
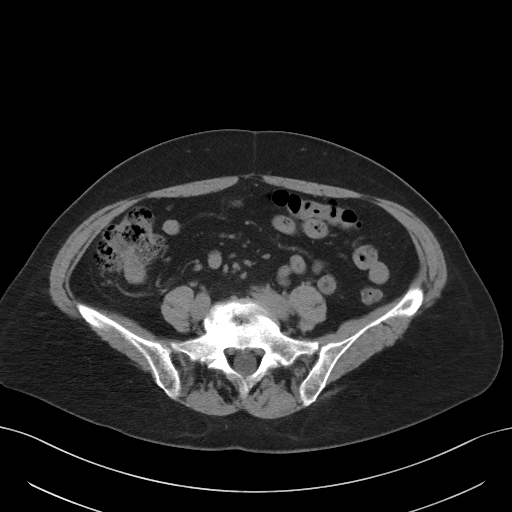
[im 44/87  soft-tissue]
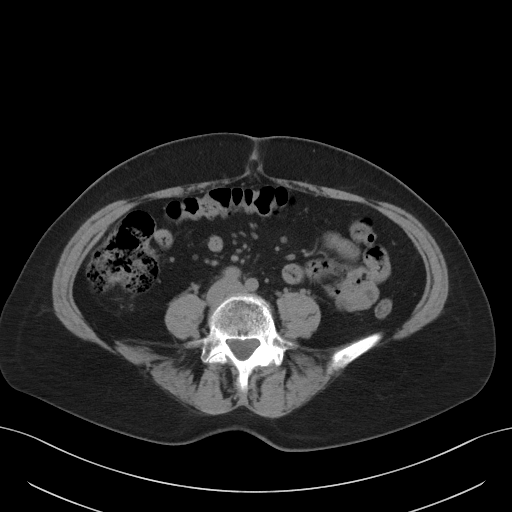
[im 48/87  soft-tissue]
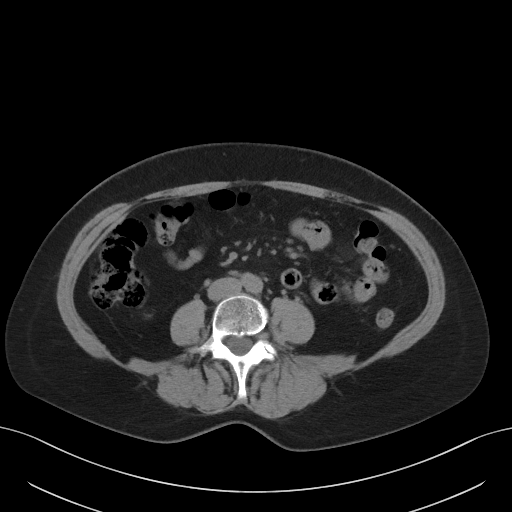
[im 58/87  soft-tissue]
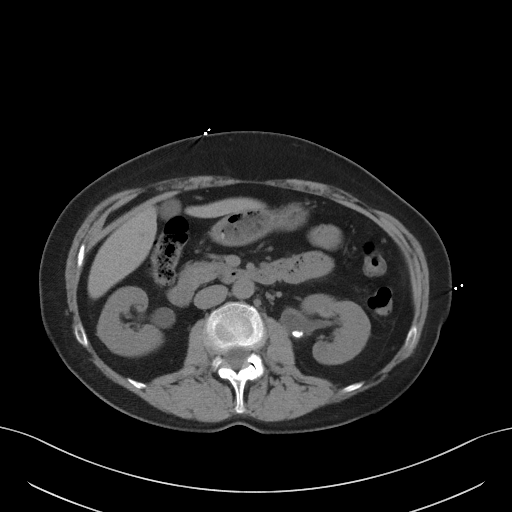
[im 58/87  bone]
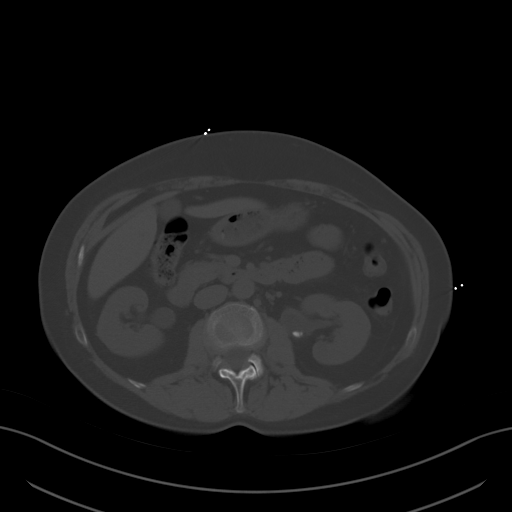
[im 63/87  soft-tissue]
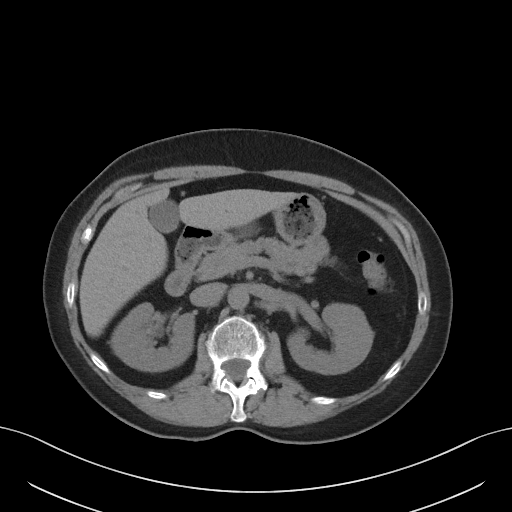
[im 67/87  soft-tissue]
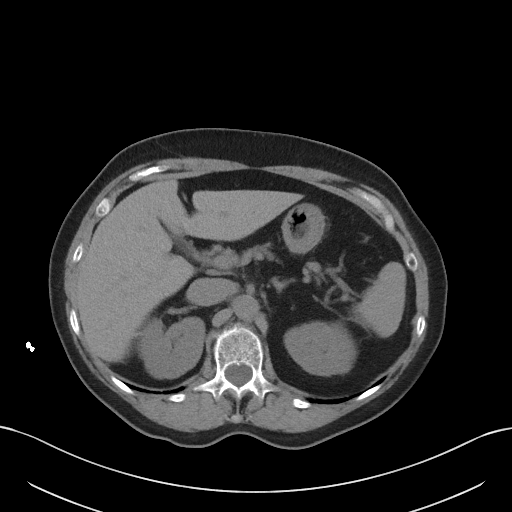
[im 77/87  soft-tissue]
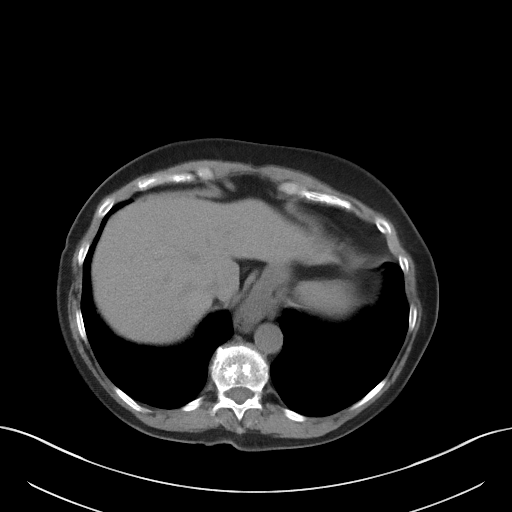
[im 82/87  soft-tissue]
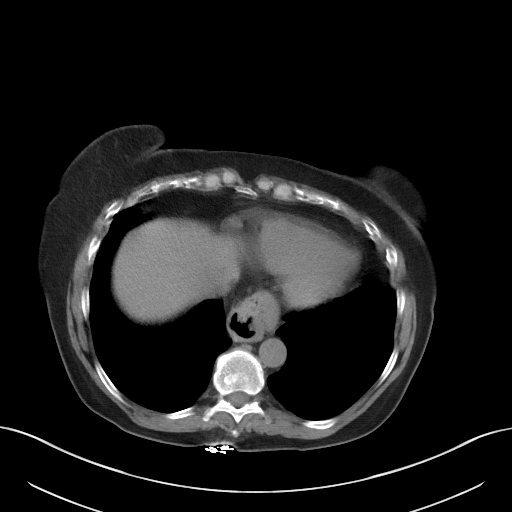

[Series 4: coronal · coronal · 0.70mm/px · 3 of 108 slices shown]
[im 36/108  soft-tissue]
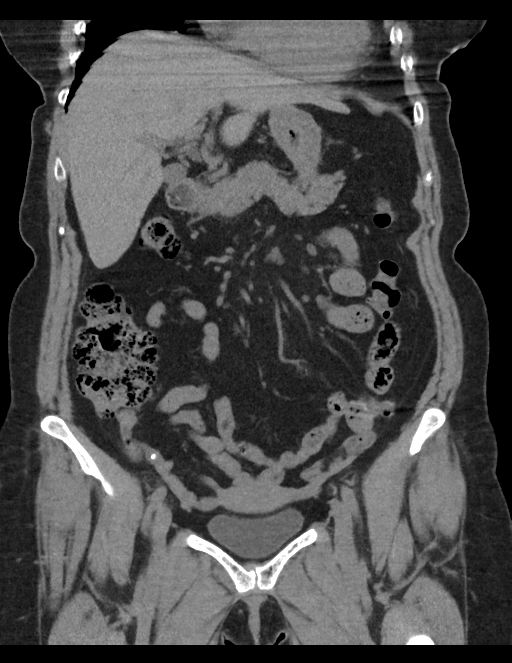
[im 48/108  soft-tissue]
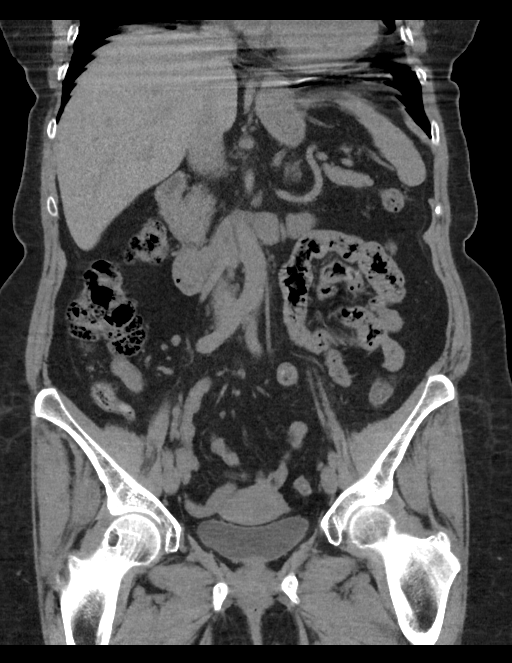
[im 60/108  soft-tissue]
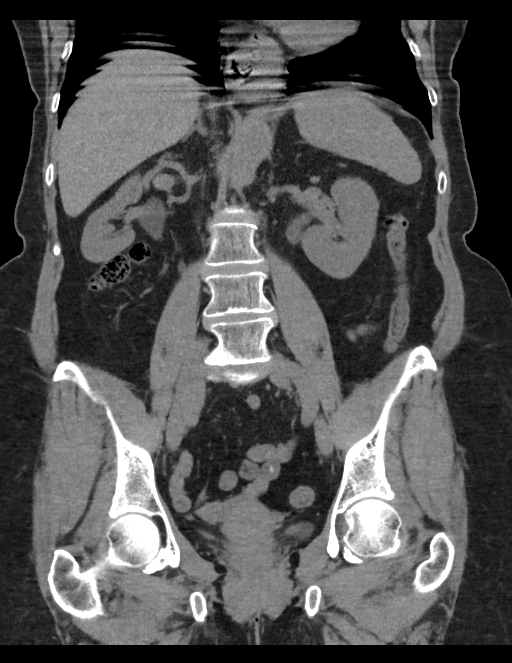

[16 of 46 positions shown; findings below may reference images not displayed]

FINDINGS: Lower chest: 6 mm left lower lobe pulmonary nodule. Patchy density
at the posterior left lung base on image 23.

Hepatobiliary: Unremarkable

Pancreas: Unremarkable

Spleen: Unremarkable

Adrenals/Urinary Tract: There are no ureteral calculi. There is no
hydronephrosis. Renal pelvises are mildly prominent bilaterally.
There are no right renal calculi. There is a tiny calculus in the
lower pole of the left kidney. There is also a 8 mm calculus in the
dependent portion of the left renal pelvis. Urothelium of the left
renal pelvis is thickened. Bladder is decompressed.

Stomach/Bowel: Moderate-sized hiatal hernia. No obvious mass in the
colon. Minimal diverticulosis of the sigmoid colon. Appendix is
decompressed. There is an appendicolith at the tip of the appendix.
There is no evidence of acute appendicitis.

Vascular/Lymphatic: No abnormal retroperitoneal adenopathy. No
evidence of aortic aneurysm

Reproductive: Uterus and adnexa are within normal limits.

Other: No free-fluid.

Musculoskeletal: No vertebral compression deformity. There is
moderate narrowing of the L4-5 and L5-S1 discs. There is vacuum
phenomenon at the L5-S1 disc.
IMPRESSION: There is an 8 mm calculus layering in the left renal pelvis
associated with urothelial thickening. The calculus may
intermittently obstruct at the ureteropelvic junction or cause an
inflammatory process along the adjacent urothelium.

Tiny calculus in the lower pole of the left kidney.

No definite ureteral calculus.  No overt hydronephrosis.

Hiatal hernia.

## 2019-01-10 IMAGING — DX DG CHEST 1V PORT
1 series · 1 of 1 positions shown · non-contrast
Comparison: None.

CLINICAL DATA: Fever and shortness of breath. Recent stone removal
2 days ago.

EXAM:
PORTABLE CHEST 1 VIEW

[chest ap]
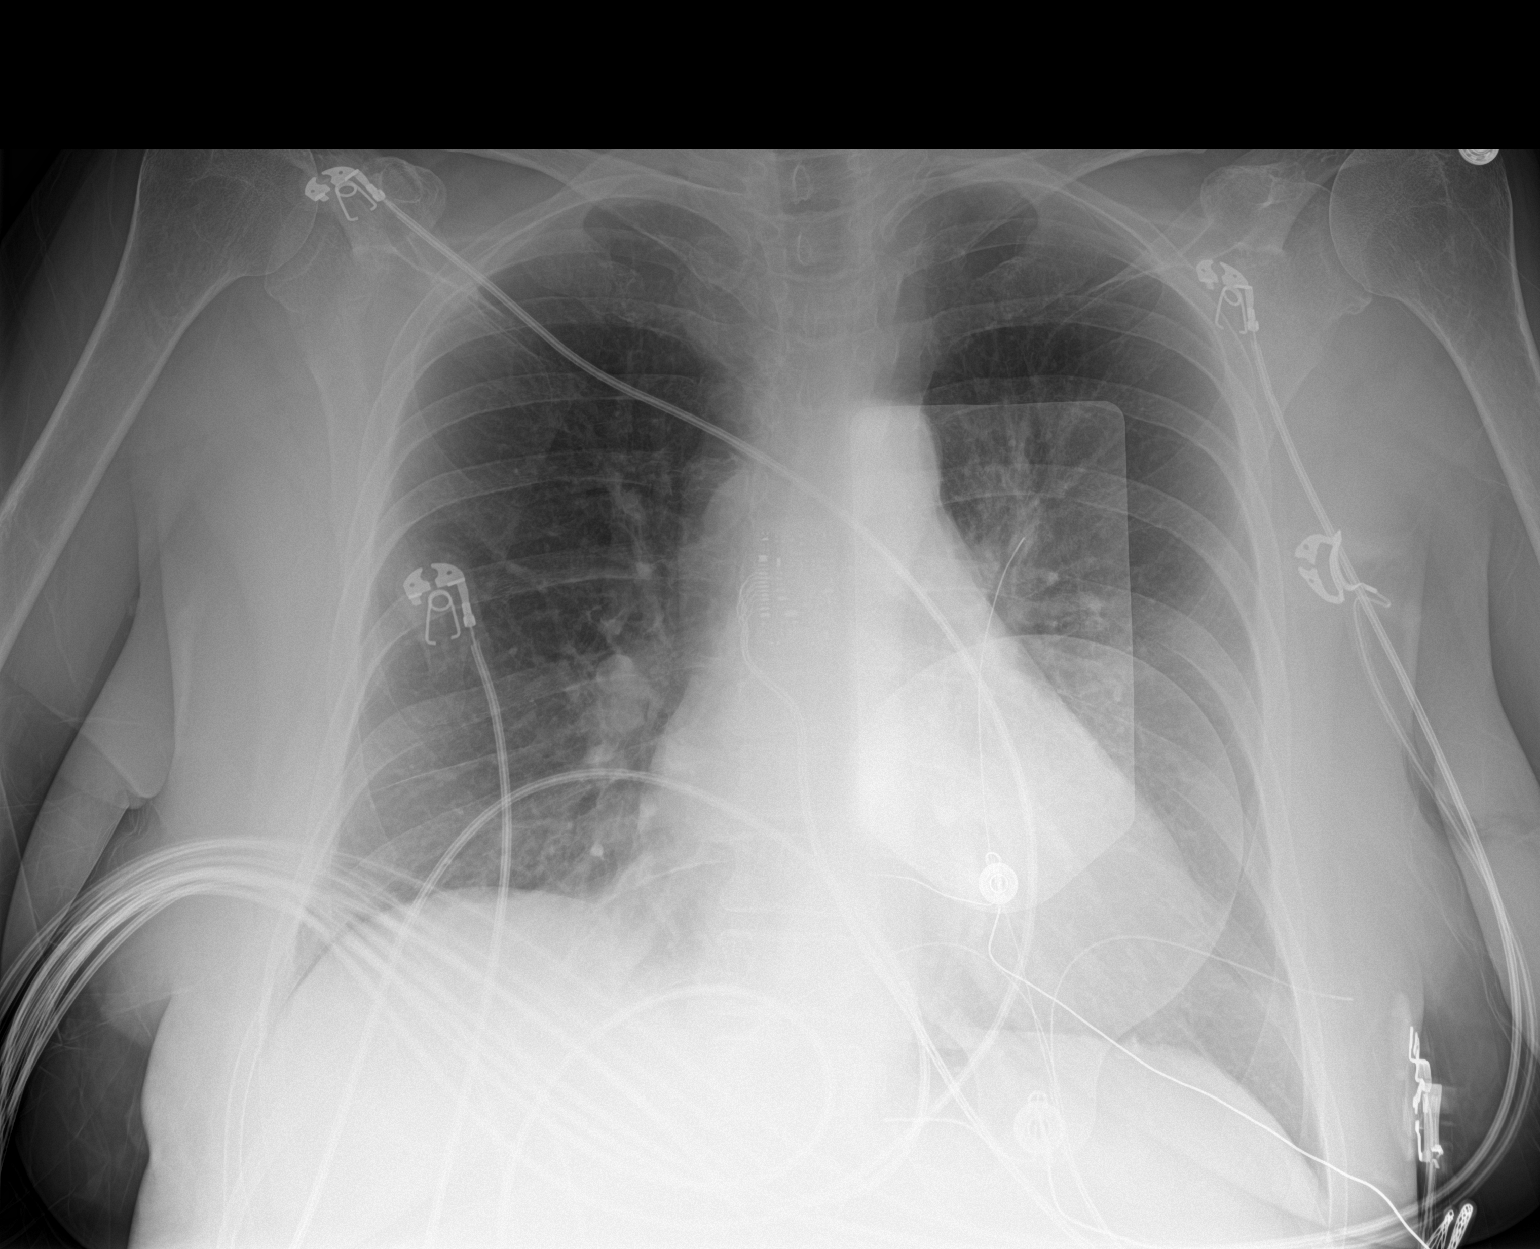

[1 of 1 positions shown; findings below may reference images not displayed]

FINDINGS: The cardiomediastinal silhouette is normal in size. Normal pulmonary
vascularity. No focal consolidation, pleural effusion, or
pneumothorax. No acute osseous abnormality.
IMPRESSION: No active disease.

## 2019-01-25 ENCOUNTER — Telehealth: Payer: Self-pay | Admitting: *Deleted

## 2019-01-25 NOTE — Telephone Encounter (Signed)
A message was left, re: follow up visit. 

## 2019-10-05 ENCOUNTER — Encounter: Payer: Self-pay | Admitting: Physician Assistant

## 2019-10-05 ENCOUNTER — Other Ambulatory Visit: Payer: Self-pay

## 2019-10-05 ENCOUNTER — Ambulatory Visit: Payer: BC Managed Care – PPO | Admitting: Physician Assistant

## 2019-10-05 VITALS — BP 133/80 | HR 105 | Temp 96.3°F | Ht 65.0 in | Wt 162.6 lb

## 2019-10-05 DIAGNOSIS — R000 Tachycardia, unspecified: Secondary | ICD-10-CM

## 2019-10-05 DIAGNOSIS — I4819 Other persistent atrial fibrillation: Secondary | ICD-10-CM | POA: Diagnosis not present

## 2019-10-05 DIAGNOSIS — R252 Cramp and spasm: Secondary | ICD-10-CM

## 2019-10-05 NOTE — Progress Notes (Signed)
Cardiology Office Note:    Date:  10/07/2019   ID:  Veronica Woods, DOB 01/30/1956, MRN [id number]  PCP:  Veronica Pollen, MD  Cardiologist:  Veronica Klein, MD  Electrophysiologist:  None   Referring MD: Veronica Woods, *   Chief Complaint  Patient presents with  . Follow-up    seen for Dr. Sallyanne Woods    History of Present Illness:    Veronica Woods is a 64 y.o. female with a hx of persistent atrial fibrillation that occurred during episode of urosepsis in October 2018. She was found to be hypotensive and extremely tachycardic during the episode.  She was found to be septic after recent ureteral stent placement.  She received antibiotic and urgent cardioversion which was unsuccessful.  Patient was subsequently placed on IV amiodarone therapy and converted to normal sinus rhythm.  Echocardiogram obtained at the time showed normal EF 50 to 55%, mild MR, PA peak pressure 33 mmHg.  TSH was normal.  Patient was last seen by Dr. Sallyanne Woods in February 2019 at which time she has not had any recurrence of atrial fibrillation.  Given her low CHA2DS2-Vasc score, she was instructed to gradually wean off of metoprolol over the next 2 to 3 weeks.  Patient presents today for cardiology office visit.  She denies any recent chest pain or worsening shortness of breath.  She does have leg cramps, however this only occurs at night and it does not occur with physical exertion.  She has very good pulses in the posterior tibial and the dorsalis pedis artery, I do not think her leg cramps is related to vascular issue.  It could be neuropathy versus electrolyte deficiency.  She preferred to have her lab work done at the next annual physical at her PCPs office.  She does have baseline sinus tachycardia after she was weaned off of metoprolol.  She is currently not on any medication at home.  She denies any palpitation however she never did have palpitation during the previous atrial fibrillation either.  Baseline  heart rate is about 100 bpm.  I recommended a basic lab work such as CBC, CMP and a TSH during her next annual physical with her PCP.  She is largely asymptomatic despite baseline sinus tachycardia.  I did recommend a nonurgent echocardiogram in the next year to evaluate her EF and mitral insufficiency since she is asymptomatic.   Past Medical History:  Diagnosis Date  . Persistent atrial fibrillation ([place]) 2018   occured in the setting of urosepsis    Past Surgical History:  Procedure Laterality Date  . DILATION AND CURETTAGE, DIAGNOSTIC / THERAPEUTIC  1990s    Current Medications: No outpatient medications have been marked as taking for the 10/05/19 encounter (Office Visit) with Veronica Deforest, PA.     Allergies:   Sulfur   Social History   Socioeconomic History  . Marital status: Married    Spouse name: Not on file  . Number of children: Not on file  . Years of education: Not on file  . Highest education level: Not on file  Occupational History  . Not on file  Tobacco Use  . Smoking status: Never Smoker  . Smokeless tobacco: Never Used  Substance and Sexual Activity  . Alcohol use: No    Alcohol/week: 0.0 standard drinks  . Drug use: No  . Sexual activity: Not on file  Other Topics Concern  . Not on file  Social History Narrative  . Not on file  Social Determinants of Health   Financial Resource Strain:   . Difficulty of Paying Living Expenses: Not on file  Food Insecurity:   . Worried About Charity fundraiser in the Last Year: Not on file  . Ran Out of Food in the Last Year: Not on file  Transportation Needs:   . Lack of Transportation (Medical): Not on file  . Lack of Transportation (Non-Medical): Not on file  Physical Activity:   . Days of Exercise per Week: Not on file  . Minutes of Exercise per Session: Not on file  Stress:   . Feeling of Stress : Not on file  Social Connections:   . Frequency of Communication with Friends and Family: Not on file  .  Frequency of Social Gatherings with Friends and Family: Not on file  . Attends Religious Services: Not on file  . Active Member of Clubs or Organizations: Not on file  . Attends Archivist Meetings: Not on file  . Marital Status: Not on file     Family History: The patient's family history includes Cancer in her father and mother; Heart disease in her mother; Hyperlipidemia in her mother; Hypertension in her mother; Stroke in her mother.  ROS:   Please see the history of present illness.     All other systems reviewed and are negative.  EKGs/Labs/Other Studies Reviewed:    The following studies were reviewed today:  Echo 06/27/2017 LV EF: 50% -  55%   -------------------------------------------------------------------  Indications:   Atrial fibrillation - 427.31.   -------------------------------------------------------------------  History:  PMH: Eletavated Troponin. Septic Shock.   -------------------------------------------------------------------  Study Conclusions   - Left ventricle: The cavity size was normal. Systolic function was  normal. The estimated ejection fraction was in the range of 50%  to 55%. Wall motion was normal; there were no regional wall  motion abnormalities.  - Mitral valve: There was mild regurgitation.  - Pulmonary arteries: Systolic pressure was mildly increased. PA  peak pressure: 33 mm Hg (S).   EKG:  EKG is ordered today.  The ekg ordered today demonstrates sinus tachycardia, low voltage.  Recent Labs: No results found for requested labs within last 8760 hours.  Recent Lipid Panel No results found for: CHOL, TRIG, HDL, CHOLHDL, VLDL, LDLCALC, LDLDIRECT  Physical Exam:    VS:  BP 133/80   Pulse (!) 105   Temp (!) 96.3 F (35.7 C)   Ht 5\' 5"  (1.651 m)   Wt 162 lb 9.6 oz (73.8 kg)   SpO2 98%   BMI 27.06 kg/m     Wt Readings from Last 3 Encounters:  10/05/19 162 lb 9.6 oz (73.8 kg)  10/07/17 153 lb 9.6 oz  (69.7 kg)  07/05/17 145 lb 3.2 oz (65.9 kg)     GEN:  Well nourished, well developed in no acute distress HEENT: Normal NECK: No JVD; No carotid bruits LYMPHATICS: No lymphadenopathy CARDIAC: RRR, no murmurs, rubs, gallops RESPIRATORY:  Clear to auscultation without rales, wheezing or rhonchi  ABDOMEN: Soft, non-tender, non-distended MUSCULOSKELETAL:  No edema; No deformity  SKIN: Warm and dry NEUROLOGIC:  Alert and oriented x 3 PSYCHIATRIC:  Normal affect   ASSESSMENT:    1. Persistent atrial fibrillation ([place])   2. Sinus tachycardia   3. Leg cramps    PLAN:    In order of problems listed above:  1. Persistent atrial fibrillation: No recent recurrence.  Previous episode occurred in the setting of urosepsis.  She is no longer  on any rate control medication as she was weaned off of metoprolol after the last office visit. CHA2DS2-Vasc score 1 (female)  2. Sinus tachycardia: Baseline heart rate is about 100 bpm however patient does not have any symptom at all.  I recommended a nonurgent echocardiogram  3. Leg cramps: Likely neuropathy. She has very good blood flow in the lower extremity.   Medication Adjustments/Labs and Tests Ordered: Current medicines are reviewed at length with the patient today.  Concerns regarding medicines are outlined above.  Orders Placed This Encounter  Procedures  . EKG 12-Lead  . ECHOCARDIOGRAM COMPLETE   No orders of the defined types were placed in this encounter.   Patient Instructions  Medication Instructions:  Your physician recommends that you continue on your current medications as directed. Please refer to the Current Medication list given to you today.  *If you need a refill on your cardiac medications before your next appointment, please call your pharmacy*  Lab Work: None  If you have labs (blood work) drawn today and your tests are completely normal, you will receive your results only by: Marland Kitchen MyChart Message (if you have MyChart)  OR . A paper copy in the mail If you have any lab test that is abnormal or we need to change your treatment, we will call you to review the results.  Testing/Procedures: Your physician has requested that you have an echocardiogram. Echocardiography is a painless test that uses sound waves to create images of your heart. It provides your doctor with information about the size and shape of your heart and how well your heart's chambers and valves are working. This procedure takes approximately one hour. There are no restrictions for this procedure. TEST WILL BE COMPLETED AT [place] [address] NO URGENCY FOR TEST  Follow-Up: At [facility], you and your health needs are our priority.  As part of our continuing mission to provide you with exceptional heart care, we have created designated Provider Care Teams.  These Care Teams include your primary Cardiologist (physician) and Advanced Practice Providers (APPs -  Physician Assistants and Nurse Practitioners) who all work together to provide you with the care you need, when you need it.  Your next appointment:   12 month(s)  The format for your next appointment:   In Person  Provider:   Sanda Klein, MD  Other Instructions     Signed, Veronica Woods, [place]  10/07/2019 [place]

## 2019-10-05 NOTE — Patient Instructions (Signed)
Medication Instructions:  Your physician recommends that you continue on your current medications as directed. Please refer to the Current Medication list given to you today.  *If you need a refill on your cardiac medications before your next appointment, please call your pharmacy*  Lab Work: None  If you have labs (blood work) drawn today and your tests are completely normal, you will receive your results only by: Marland Kitchen MyChart Message (if you have MyChart) OR . A paper copy in the mail If you have any lab test that is abnormal or we need to change your treatment, we will call you to review the results.  Testing/Procedures: Your physician has requested that you have an echocardiogram. Echocardiography is a painless test that uses sound waves to create images of your heart. It provides your doctor with information about the size and shape of your heart and how well your heart's chambers and valves are working. This procedure takes approximately one hour. There are no restrictions for this procedure. TEST WILL BE COMPLETED AT [place] [address] NO URGENCY FOR TEST  Follow-Up: At [facility], you and your health needs are our priority.  As part of our continuing mission to provide you with exceptional heart care, we have created designated Provider Care Teams.  These Care Teams include your primary Cardiologist (physician) and Advanced Practice Providers (APPs -  Physician Assistants and Nurse Practitioners) who all work together to provide you with the care you need, when you need it.  Your next appointment:   12 month(s)  The format for your next appointment:   In Person  Provider:   Sanda Klein, MD  Other Instructions

## 2019-10-07 ENCOUNTER — Encounter: Payer: Self-pay | Admitting: Physician Assistant

## 2019-10-07 NOTE — Addendum Note (Signed)
Addended byEulas Post, Yarah Fuente on: 10/07/2019 11:23 PM   Modules accepted: Level of Service

## 2019-11-30 ENCOUNTER — Telehealth: Payer: Self-pay | Admitting: *Deleted

## 2019-11-30 ENCOUNTER — Telehealth: Payer: Self-pay | Admitting: Cardiovascular Disease

## 2019-11-30 DIAGNOSIS — I4819 Other persistent atrial fibrillation: Secondary | ICD-10-CM

## 2019-11-30 NOTE — Telephone Encounter (Signed)
New message   Patient c/o Palpitations:  High priority if patient c/o lightheadedness, shortness of breath, or chest pain  1) How long have you had palpitations/irregular HR/ Afib? Are you having the symptoms now? Per patient since yesterday morning, yes   2) Are you currently experiencing lightheadedness, SOB or CP?no   3) Do you have a history of afib (atrial fibrillation) or irregular heart rhythm? Yes   4) Have you checked your BP or HR? (document readings if available): patient states that b/p is okay, hr is between 90-100 on today  5) Are you experiencing any other symptoms? cough

## 2019-11-30 NOTE — Telephone Encounter (Signed)
Can we get a 14 day Zio? Hao ordered echo in early Feb and not yet done, can you follow up on tat also please?

## 2019-11-30 NOTE — Telephone Encounter (Signed)
Returned the call to the patient. She was agreeable to the monitor and having her echo completed. Orders placed and message sent to scheduling.   She had wanted to wait to have the ECHO until she had her covid vaccine but she is getting her second dose on Saturday.  She has been assured that she should go ahead with the second dose as planned.

## 2019-11-30 NOTE — Telephone Encounter (Signed)
Returned call to patient of Dr. C who reports palps yesterday, progressively worsened but has improved today. HR as high as 120 bpm. Patient reports a little tightness across upper chest and when lying down, she feels like she needs to cough. No other symptoms.   She had one episode in the past when she had a kidney stone, sepsis. She had a cardioversion at this time.  Today her HR is 90s-100s and her BP is 125/80  Patient is on NO medications - has not been taking medications since early 2019. No supplements either  Patient is scheduled to get second COVID vaccine on Saturday    Advised will send message to Dr. Loletha Grayer and Lattie Haw RN to review and advise

## 2019-11-30 NOTE — Telephone Encounter (Signed)
Patient enrolled for 14 day ZIO XT long term holter monitor to be mailed to her home.  Confirmed insurance information is [employer].

## 2019-12-04 ENCOUNTER — Ambulatory Visit (INDEPENDENT_AMBULATORY_CARE_PROVIDER_SITE_OTHER): Payer: BC Managed Care – PPO

## 2019-12-04 DIAGNOSIS — I4819 Other persistent atrial fibrillation: Secondary | ICD-10-CM

## 2020-01-01 ENCOUNTER — Ambulatory Visit (HOSPITAL_COMMUNITY): Payer: BC Managed Care – PPO | Attending: Physician Assistant

## 2020-01-01 ENCOUNTER — Other Ambulatory Visit: Payer: Self-pay

## 2020-01-01 DIAGNOSIS — R252 Cramp and spasm: Secondary | ICD-10-CM | POA: Diagnosis not present

## 2020-01-01 DIAGNOSIS — R000 Tachycardia, unspecified: Secondary | ICD-10-CM | POA: Insufficient documentation

## 2020-01-01 DIAGNOSIS — I4819 Other persistent atrial fibrillation: Secondary | ICD-10-CM | POA: Diagnosis not present

## 2020-01-02 ENCOUNTER — Telehealth: Payer: Self-pay | Admitting: *Deleted

## 2020-01-02 NOTE — Telephone Encounter (Signed)
-----   Message from Sanda Klein, MD sent at 01/02/2020 11:24 AM EDT ----- Very minor abnormalities on the monitor, no serious rhythm problems. Metoprolol should be an appropriate and effective treatment for these.

## 2020-01-02 NOTE — Telephone Encounter (Signed)
Patient made aware of results and verbalized understanding. She has not taken the Metoprolol in several years. She would like to know if she should be on it. She stated that she has occasional episodes of palpitations but those are brief and rare.  She has been advised that if the palpitations do not bother her or effect her daily living then she may not need it.

## 2020-01-03 MED ORDER — METOPROLOL TARTRATE 25 MG PO TABS: 25.0000 mg | ORAL_TABLET | Freq: Two times a day (BID) | ORAL | 3 refills | Status: DC | PRN

## 2020-01-03 NOTE — Telephone Encounter (Signed)
The patient has been made aware and the prescription has been sent in for home.

## 2020-01-03 NOTE — Telephone Encounter (Signed)
Correct. She can have a Rx for metoprolol tartrate 25 mg to take once-twice daily as needed for palpitations, #30, RF3.

## 2020-01-03 NOTE — Progress Notes (Signed)
Normal pumping function, no significant valve issue.

## 2020-02-05 ENCOUNTER — Other Ambulatory Visit: Payer: Self-pay

## 2020-02-05 ENCOUNTER — Ambulatory Visit (INDEPENDENT_AMBULATORY_CARE_PROVIDER_SITE_OTHER): Payer: BC Managed Care – PPO | Admitting: Ophthalmology

## 2020-02-05 ENCOUNTER — Encounter (INDEPENDENT_AMBULATORY_CARE_PROVIDER_SITE_OTHER): Payer: Self-pay | Admitting: Ophthalmology

## 2020-02-05 DIAGNOSIS — H33312 Horseshoe tear of retina without detachment, left eye: Secondary | ICD-10-CM | POA: Insufficient documentation

## 2020-02-05 DIAGNOSIS — H43813 Vitreous degeneration, bilateral: Secondary | ICD-10-CM | POA: Diagnosis not present

## 2020-02-05 DIAGNOSIS — D3132 Benign neoplasm of left choroid: Secondary | ICD-10-CM | POA: Diagnosis not present

## 2020-02-05 NOTE — Assessment & Plan Note (Signed)
History of retinal tear left eye inferotemporal, good laser retinopexy, no extension, no new breaks.

## 2020-02-05 NOTE — Assessment & Plan Note (Signed)
The nature of choroidal nevus was discussed with the patient and an informational form was offered.  Photo documentation was discussed with the patient.  Periodic follow-up may be needed for a lifetime. The patient's questions were answered. At minimum, annual exams will be needed if no signs of early growth.  OS, will continue to monitor on an annual basis, color photos next visit

## 2020-02-05 NOTE — Progress Notes (Signed)
02/05/2020     CHIEF COMPLAINT Patient presents for Retina Follow Up   HISTORY OF PRESENT ILLNESS: Veronica Woods is a 64 y.o. female who presents to the clinic today for:   HPI    Retina Follow Up    Patient presents with  Other.  In left eye.  Duration of 4 months.  Since onset it is stable.          Comments    4 mon f.u - OCT OU Patient denies change in vision and overall has no complaints.        Last edited by Gerda Diss on 02/05/2020  8:14 AM. (History)      Referring physician: Horald Pollen, MD [place],  [place] [address]  HISTORICAL INFORMATION:   Selected notes from the [facility]: No current outpatient medications on file. (Ophthalmic Drugs)   No current facility-administered medications for this visit. (Ophthalmic Drugs)   Current Outpatient Medications (Other)  Medication Sig  . metoprolol tartrate (LOPRESSOR) 25 MG tablet Take 1 tablet (25 mg total) by mouth 2 (two) times daily as needed (For palpitations).   No current facility-administered medications for this visit. (Other)      REVIEW OF SYSTEMS:    ALLERGIES Allergies  Allergen Reactions  . Sulfur Anaphylaxis and Other (See Comments)    Childhood reaction    PAST MEDICAL HISTORY Past Medical History:  Diagnosis Date  . Persistent atrial fibrillation ([place]) 2018   occured in the setting of urosepsis   Past Surgical History:  Procedure Laterality Date  . DILATION AND CURETTAGE, DIAGNOSTIC / THERAPEUTIC  1990s    FAMILY HISTORY Family History  Problem Relation Age of Onset  . Cancer Mother   . Heart disease Mother   . Hyperlipidemia Mother   . Hypertension Mother   . Stroke Mother   . Cancer Father     SOCIAL HISTORY Social History   Tobacco Use  . Smoking status: Never Smoker  . Smokeless tobacco: Never Used  Substance Use Topics  . Alcohol use: No    Alcohol/week: 0.0 standard drinks  . Drug use: No         OPHTHALMIC EXAM:  Base Eye Exam    Visual Acuity (Snellen - Linear)      Right Left   Dist cc 20/30-2 20/80-1   Dist ph cc NI 20/50   Correction: Glasses       Tonometry (Tonopen, 8:18 AM)      Right Left   Pressure 20 16       Pupils      Pupils   Right PERRL   Left PERRL       Visual Fields (Counting fingers)      Left Right    Full Full       Extraocular Movement      Right Left    Full Full       Neuro/Psych    Oriented x3: Yes   Mood/Affect: Normal       Dilation    Left eye: 1.0% Mydriacyl, 2.5% Phenylephrine @ 8:18 AM        Slit Lamp and Fundus Exam    External Exam      Right Left   External Normal Normal       Slit Lamp Exam      Right Left   Lids/Lashes Normal Normal   Conjunctiva/Sclera White and  quiet White and quiet   Cornea Clear Clear   Anterior Chamber Deep and quiet Deep and quiet   Iris Round and reactive Round and reactive   Anterior Vitreous Normal Normal       Fundus Exam      Right Left   Posterior Vitreous  Posterior vitreous detachment   Disc  Normal   C/D Ratio  0.4   Macula  Normal   Vessels  Normal   Periphery  Good laser retinopexy inferotemporal round break at 4:00 meridian.  Incidental flat choroidal nevus 1.2 disc. Diameter inferonasal quadrant near the equator, no fluid, no atrophy, no lipofuscin, no weakness          IMAGING AND PROCEDURES  Imaging and Procedures for 02/05/20  OCT, Retina - OU - Both Eyes       Right Eye Quality was good. Scan locations included subfoveal. Central Foveal Thickness: 281. Progression has no prior data. Findings include normal observations.   Left Eye Quality was good. Scan locations included subfoveal. Central Foveal Thickness: 295. Progression has no prior data. Findings include normal observations.   Notes OS, incidental posterior vitreous detachment,                ASSESSMENT/PLAN:  Choroidal nevus, left eye The nature of choroidal nevus was  discussed with the patient and an informational form was offered.  Photo documentation was discussed with the patient.  Periodic follow-up may be needed for a lifetime. The patient's questions were answered. At minimum, annual exams will be needed if no signs of early growth.  OS, will continue to monitor on an annual basis, color photos next visit  Horseshoe tear of retina of left eye without detachment History of retinal tear left eye inferotemporal, good laser retinopexy, no extension, no new breaks.      ICD-10-CM   1. Posterior vitreous detachment of both eyes  H43.813 OCT, Retina - OU - Both Eyes  2. Horseshoe tear of retina of left eye without detachment  H33.312   3. Choroidal nevus, left eye  D31.32     1.  2.  3.  Ophthalmic Meds Ordered this visit:  No orders of the defined types were placed in this encounter.      Return in about 1 year (around 02/04/2021) for DILATE OU, COLOR FP.  There are no Patient Instructions on file for this visit.   Explained the diagnoses, plan, and follow up with the patient and they expressed understanding.  Patient expressed understanding of the importance of proper follow up care.   Clent Demark Anivea Velasques M.D. Diseases & Surgery of the Retina and Vitreous [facility]ina [facility] 02/05/20     Abbreviations: M myopia (nearsighted); A astigmatism; H hyperopia (farsighted); P presbyopia; Mrx spectacle prescription;  CTL contact lenses; OD right eye; OS left eye; OU both eyes  XT exotropia; ET esotropia; PEK punctate epithelial keratitis; PEE punctate epithelial erosions; DES dry eye syndrome; MGD meibomian gland dysfunction; ATs artificial tears; PFAT's preservative free artificial tears; [place] nuclear sclerotic cataract; PSC posterior subcapsular cataract; ERM epi-retinal membrane; PVD posterior vitreous detachment; RD retinal detachment; DM diabetes mellitus; DR diabetic retinopathy; NPDR non-proliferative diabetic retinopathy; PDR  proliferative diabetic retinopathy; CSME clinically significant macular edema; DME diabetic macular edema; dbh dot blot hemorrhages; CWS cotton wool spot; POAG primary open angle glaucoma; C/D cup-to-disc ratio; HVF humphrey visual field; GVF goldmann visual field; OCT optical coherence tomography; IOP intraocular pressure; BRVO Branch retinal vein occlusion; CRVO central retinal  vein occlusion; CRAO central retinal artery occlusion; BRAO branch retinal artery occlusion; RT retinal tear; SB scleral buckle; PPV pars plana vitrectomy; VH Vitreous hemorrhage; PRP panretinal laser photocoagulation; IVK intravitreal kenalog; VMT vitreomacular traction; MH Macular hole;  NVD neovascularization of the disc; NVE neovascularization elsewhere; AREDS age related eye disease study; ARMD age related macular degeneration; POAG primary open angle glaucoma; EBMD epithelial/anterior basement membrane dystrophy; ACIOL anterior chamber intraocular lens; IOL intraocular lens; PCIOL posterior chamber intraocular lens; Phaco/IOL phacoemulsification with intraocular lens placement; [place] photorefractive keratectomy; LASIK laser assisted in situ keratomileusis; HTN hypertension; DM diabetes mellitus; COPD chronic obstructive pulmonary disease

## 2020-09-26 ENCOUNTER — Emergency Department (HOSPITAL_COMMUNITY): Payer: BC Managed Care – PPO

## 2020-09-26 ENCOUNTER — Encounter (HOSPITAL_COMMUNITY): Payer: Self-pay

## 2020-09-26 ENCOUNTER — Emergency Department (HOSPITAL_COMMUNITY)
Admission: EM | Admit: 2020-09-26 | Discharge: 2020-09-27 | Disposition: A | Discharge status: Home / Self Care | Payer: BC Managed Care – PPO | Attending: Emergency Medicine | Admitting: Emergency Medicine

## 2020-09-26 ENCOUNTER — Other Ambulatory Visit: Payer: Self-pay

## 2020-09-26 DIAGNOSIS — Z20822 Contact with and (suspected) exposure to covid-19: Secondary | ICD-10-CM | POA: Diagnosis not present

## 2020-09-26 DIAGNOSIS — R0789 Other chest pain: Secondary | ICD-10-CM | POA: Diagnosis not present

## 2020-09-26 DIAGNOSIS — R072 Precordial pain: Secondary | ICD-10-CM

## 2020-09-26 DIAGNOSIS — M79602 Pain in left arm: Secondary | ICD-10-CM | POA: Insufficient documentation

## 2020-09-26 HISTORY — DX: Disorder of kidney and ureter, unspecified: N28.9

## 2020-09-26 HISTORY — DX: Sepsis, unspecified organism: A41.9

## 2020-09-26 LAB — BASIC METABOLIC PANEL
Anion gap: 7 (ref 5–15)
BUN: 16 mg/dL (ref 8–23)
CO2: 25 mmol/L (ref 22–32)
Calcium: 9.8 mg/dL (ref 8.9–10.3)
Chloride: 107 mmol/L (ref 98–111)
Creatinine, Ser: 0.7 mg/dL (ref 0.44–1.00)
GFR, Estimated: >60 mL/min (ref >60)
Glucose, Bld: 102 mg/dL — ABNORMAL HIGH (ref 70–99)
Potassium: 3.9 mmol/L (ref 3.5–5.1)
Sodium: 139 mmol/L (ref 135–145)

## 2020-09-26 LAB — CBC
HCT: 46.4 % — ABNORMAL HIGH (ref 36.0–46.0)
Hemoglobin: 15.8 g/dL — ABNORMAL HIGH (ref 12.0–15.0)
MCH: 30.6 pg (ref 26.0–34.0)
MCHC: 34.1 g/dL (ref 30.0–36.0)
MCV: 89.9 fL (ref 80.0–100.0)
Platelets: 300 10*3/uL (ref 150–400)
RBC: 5.16 MIL/uL — ABNORMAL HIGH (ref 3.87–5.11)
RDW: 12.4 % (ref 11.5–15.5)
WBC: 5.7 10*3/uL (ref 4.0–10.5)
nRBC: 0 % (ref 0.0–0.2)

## 2020-09-26 LAB — TROPONIN I (HIGH SENSITIVITY): Troponin I (High Sensitivity): 3 ng/L (ref <18)

## 2020-09-26 MED ORDER — FAMOTIDINE 20 MG PO TABS
20.0000 mg | ORAL_TABLET | Freq: Once | ORAL | Status: AC
  Administered 2020-09-26: 20 mg via ORAL
  Filled 2020-09-26: qty 1

## 2020-09-26 MED ORDER — ACETAMINOPHEN 500 MG PO TABS
1000.0000 mg | ORAL_TABLET | Freq: Once | ORAL | Status: AC
  Administered 2020-09-26: 1000 mg via ORAL
  Filled 2020-09-26: qty 2

## 2020-09-26 MED ORDER — ALUM & MAG HYDROXIDE-SIMETH 200-200-20 MG/5ML PO SUSP
30.0000 mL | Freq: Once | ORAL | Status: AC
  Administered 2020-09-26: 30 mL via ORAL
  Filled 2020-09-26: qty 30

## 2020-09-26 NOTE — Discharge Instructions (Addendum)
It was our pleasure to provide your ER care today - we hope that you feel better.  Overall, your lab tests look good/normal.  Your covid test results and pending and should be back in 6-8 hours - you may check MyChart, or call in AM for results.   Follow up with your doctor/cardiologist in the coming week - call office to arrange appointment.   If reflux symptoms, try pepcid or maalox as need.   Return to ER if worse, new symptoms, high fevers, recurrent or persistent chest pain, persistent fast heart beating, increased trouble breathing, or other concern.

## 2020-09-26 NOTE — ED Notes (Signed)
"  Assisted pt to restroom while waiting in lobby. Pt able to pull and pivot to get onto toilet.

## 2020-09-26 NOTE — ED Provider Notes (Signed)
[facility] DEPT Provider Note   CSN: [id number] Arrival date & time: 09/26/20  1540     History Chief Complaint  Patient presents with  . chest burning  . Arm Pain    Veronica Woods is a 65 y.o. female.  Patient w hx afib, c/o chest tightness in past day. Occurs at rest, more pronounced at night when resting, mid chest and briefly felt in left arm, acute onset, mild, dull/burning, persistent, at rest. No associated sob, nv or diaphoresis. No pleuritic pain. Denies heartburn/indigestion. Denies back or flank pain. No neck pain. No personal or fam hx cad. Denies hx dvt or pe, no recent surgery, immobility, trauma/travel, or leg pain or swelling. Denies chest wall injury or strain. Does note recent non prod cough. No fevers, t 99 in ED. No known covid exposure and has been vaccinated.   The history is provided by the patient.  Arm Pain Associated symptoms include chest pain. Pertinent negatives include no abdominal pain, no headaches and no shortness of breath.       Past Medical History:  Diagnosis Date  . Persistent atrial fibrillation ([place]) 2018   occured in the setting of urosepsis  . Renal disorder    kidney stone  . Sepsis [facility])     Patient Active Problem List   Diagnosis Date Noted  . Choroidal nevus, left eye 02/05/2020  . Horseshoe tear of retina of left eye without detachment 02/05/2020  . Posterior vitreous detachment of both eyes 02/05/2020  . Sepsis ([place]) 06/25/2017  . Atrial fibrillation with rapid ventricular response ([place])   . Systolic click   . Flank pain 06/04/2017  . Left lower quadrant pain 06/04/2017  . Kidney stone 06/04/2017  . Hematuria 05/25/2017  . Acute UTI 05/25/2017  . Pelvic pain 05/11/2017  . Clinical infection 05/11/2017  . Lower urinary tract symptoms (LUTS) 05/11/2017  . Dysuria 05/11/2017    Past Surgical History:  Procedure Laterality Date  . DILATION AND CURETTAGE, DIAGNOSTIC / THERAPEUTIC  1990s   . EYE SURGERY       OB History   No obstetric history on file.     Family History  Problem Relation Age of Onset  . Cancer Mother   . Heart disease Mother   . Hyperlipidemia Mother   . Hypertension Mother   . Stroke Mother   . Cancer Father     Social History   Tobacco Use  . Smoking status: Never Smoker  . Smokeless tobacco: Never Used  Vaping Use  . Vaping Use: Never used  Substance Use Topics  . Alcohol use: No    Alcohol/week: 0.0 standard drinks  . Drug use: No    Home Medications Prior to Admission medications   Medication Sig Start Date End Date Taking? Authorizing Provider  fesoterodine (TOVIAZ) 4 MG TB24 tablet Take 4 mg by mouth daily.   Yes [provider]  metoprolol tartrate (LOPRESSOR) 25 MG tablet Take 25 mg by mouth 2 (two) times daily as needed (palpitations).   Yes [provider]  metoprolol tartrate (LOPRESSOR) 25 MG tablet Take 1 tablet (25 mg total) by mouth 2 (two) times daily as needed (For palpitations). 01/03/20 04/02/20  Croitoru, Mihai, MD    Allergies    Elemental sulfur  Review of Systems   Review of Systems  Constitutional: Negative for chills and fever.  HENT: Negative for sore throat.   Eyes: Negative for redness.  Respiratory: Positive for cough. Negative for shortness  of breath.   Cardiovascular: Positive for chest pain. Negative for leg swelling.  Gastrointestinal: Negative for abdominal pain, diarrhea and vomiting.  Genitourinary: Negative for dysuria and flank pain.  Musculoskeletal: Negative for back pain and neck pain.  Skin: Negative for rash.  Neurological: Negative for headaches.  Hematological: Does not bruise/bleed easily.  Psychiatric/Behavioral: Negative for confusion.    Physical Exam Updated Vital Signs BP 106/63 (BP Location: Left Arm)   Pulse 65   Temp 99 F (37.2 C) (Oral)   Resp 16   Ht 1.651 m (5\' 5" )   Wt 66.7 kg   SpO2 100%   BMI 24.46 kg/m   Physical Exam Vitals and nursing  note reviewed.  Constitutional:      Appearance: Normal appearance. She is well-developed.  HENT:     Head: Atraumatic.     Nose: Nose normal.     Mouth/Throat:     Mouth: Mucous membranes are moist.  Eyes:     General: No scleral icterus.    Conjunctiva/sclera: Conjunctivae normal.  Neck:     Trachea: No tracheal deviation.  Cardiovascular:     Rate and Rhythm: Normal rate and regular rhythm.     Pulses: Normal pulses.     Heart sounds: Normal heart sounds. No murmur heard. No friction rub. No gallop.   Pulmonary:     Effort: Pulmonary effort is normal. No respiratory distress.     Breath sounds: Normal breath sounds.  Abdominal:     General: Bowel sounds are normal. There is no distension.     Palpations: Abdomen is soft.     Tenderness: There is no abdominal tenderness.  Genitourinary:    Comments: No cva tenderness.  Musculoskeletal:        General: No swelling or tenderness.     Cervical back: Normal range of motion and neck supple. No rigidity. No muscular tenderness.     Right lower leg: No edema.     Left lower leg: No edema.  Skin:    General: Skin is warm and dry.     Findings: No rash.  Neurological:     Mental Status: She is alert.     Comments: Alert, speech normal.   Psychiatric:        Mood and Affect: Mood normal.     ED Results / Procedures / Treatments   Labs (all labs ordered are listed, but only abnormal results are displayed) Results for orders placed or performed during the hospital encounter of 99/24/26  Basic metabolic panel  Result Value Ref [place]   Sodium 139 135 - 145 mmol/L   Potassium 3.9 3.5 - 5.1 mmol/L   Chloride 107 98 - 111 mmol/L   CO2 25 22 - 32 mmol/L   Glucose, Bld 102 (H) 70 - 99 mg/dL   BUN 16 8 - 23 mg/dL   Creatinine, Ser 0.70 0.44 - 1.00 mg/dL   Calcium 9.8 8.9 - 10.3 mg/dL   GFR, Estimated >60 >60 mL/min   Anion gap 7 5 - 15  CBC  Result Value Ref Range   WBC 5.7 4.0 - 10.5 K/uL   RBC 5.16 (H) 3.87 - 5.11 MIL/uL    Hemoglobin 15.8 (H) 12.0 - 15.0 g/dL   HCT 46.4 (H) 36.0 - 46.0 %   MCV 89.9 80.0 - 100.0 fL   MCH 30.6 26.0 - 34.0 pg   MCHC 34.1 30.0 - 36.0 g/dL   RDW 12.4 11.5 - 15.5 %   Platelets  300 150 - 400 K/uL   nRBC 0.0 0.0 - 0.2 %  Troponin I (High Sensitivity)  Result Value Ref Range   Troponin I (High Sensitivity) 3 <18 ng/L   DG Chest 2 View  Result Date: 09/26/2020 CLINICAL DATA:  Left chest burning pain since last night now becoming more intense. EXAM: CHEST - 2 VIEW COMPARISON:  Chest radiograph June 25, 2017 FINDINGS: The heart size and mediastinal contours are within normal limits. No focal consolidation. No pleural effusion. No pneumothorax. The visualized skeletal structures are unremarkable. IMPRESSION: No acute cardiopulmonary process. Electronically Signed   By: Dahlia Bailiff MD   On: 09/26/2020 16:25    EKG EKG Interpretation  Date/Time:  Thursday September 26 2020 15:52:20 EST Ventricular Rate:  99 PR Interval:    QRS Duration: 93 QT Interval:  339 QTC Calculation: 435 R Axis:   70 Text Interpretation: Sinus rhythm Low voltage, extremity and precordial leads Nonspecific T wave abnormality Confirmed by Lajean Saver [phone]) on 09/26/2020 4:13:49 PM   Radiology DG Chest 2 View  Result Date: 09/26/2020 CLINICAL DATA:  Left chest burning pain since last night now becoming more intense. EXAM: CHEST - 2 VIEW COMPARISON:  Chest radiograph June 25, 2017 FINDINGS: The heart size and mediastinal contours are within normal limits. No focal consolidation. No pleural effusion. No pneumothorax. The visualized skeletal structures are unremarkable. IMPRESSION: No acute cardiopulmonary process. Electronically Signed   By: Dahlia Bailiff MD   On: 09/26/2020 16:25    Procedures Procedures   Medications Ordered in ED Medications  famotidine (PEPCID) tablet 20 mg (has no administration in time range)  acetaminophen (TYLENOL) tablet 1,000 mg (has no administration in time range)   alum & mag hydroxide-simeth (MAALOX/MYLANTA) 200-200-20 MG/5ML suspension 30 mL (has no administration in time range)    ED Course  I have reviewed the triage vital signs and the nursing notes.  Pertinent labs & imaging results that were available during my care of the patient were reviewed by me and considered in my medical decision making (see chart for details).    MDM Rules/Calculators/A&P                         Labs sent. Cxr. Ecg.   Reviewed nursing notes and prior charts for additional history.   Labs reviewed/interpreted by me - initial trop normal.   CXR reviewed/interpreted by me - no pna.   Acetaminophen, pepcid/maalox given for symptom relief.   Await delta trop.   Covid swab also sent given new cough.   Currently on monitor, hr 78, rr 16, and pulse ox 100% room air.   2350 delta trop pending - signed out to Dr Randal Buba to check delta trop. If normal and not increasing, feel likely d/c to home then.      Final Clinical Impression(s) / ED Diagnoses Final diagnoses:  None    Rx / DC Orders ED Discharge Orders    None       Lajean Saver, MD 09/26/20 2352

## 2020-09-26 NOTE — ED Triage Notes (Signed)
Patient c/o left chest burning last night and began again this afternoon, but more intense. Patient also reports left arm pain (heaviness) today.

## 2020-09-27 LAB — SARS CORONAVIRUS 2 (TAT 6-24 HRS): SARS Coronavirus 2: NEGATIVE

## 2020-09-27 LAB — TROPONIN I (HIGH SENSITIVITY): Troponin I (High Sensitivity): 3 ng/L (ref <18)

## 2020-09-30 ENCOUNTER — Telehealth: Payer: Self-pay

## 2020-09-30 NOTE — Telephone Encounter (Signed)
Transition Care Management Follow-up Telephone Call  Date of discharge and from where: 09/27/20 from [facility]   How have you been since you were released from the hospital? Pt states she is feeling well and is not having any trouble at this time.   Any questions or concerns? No  Items Reviewed:  Did the pt receive and understand the discharge instructions provided? Yes   Medications obtained and verified? Yes   Other? No   Any new allergies since your discharge? No   Dietary orders reviewed? Heart Healthy.   Do you have support at home? Yes   Functional Questionnaire: (I = Independent and D = Dependent) ADLs: I  Bathing/Dressing- I  Meal Prep- I  Eating- I  Maintaining continence- I  Transferring/Ambulation- I  Managing Meds- I   Follow up appointments reviewed:   PCP Hospital f/u appt confirmed? No    Specialist Hospital f/u appt confirmed? Yes  Scheduled to see Jory Sims, NP on 10/11/20 @ 08:15am.  Are transportation arrangements needed? No   If their condition worsens, is the pt aware to call PCP or go to the Emergency Dept.? Yes  Was the patient provided with contact information for the PCP's office or ED? Yes  Was to pt encouraged to call back with questions or concerns? Yes

## 2020-10-04 NOTE — Progress Notes (Signed)
Cardiology Office Note   Date:  10/11/2020   ID:  Veronica Woods, DOB Aug 12, 1956, MRN [id number]  PCP:  Horald Pollen, MD  Cardiologist:  Croitoru  CC: Chest pain and shortness of breath after Covid ejection   History of Present Illness: Veronica Woods is a 65 y.o. female who presents for ongoing assessment and management of paroxysmal atrial fibrillation, which began after an episode of urosepsis in October of 2018. She was placed on IV amiodarone and converted to NSR  She was last seen in the office by [name], [place] on 10/05/2019 for routine office visit. She was without cardiac complaints a that time. She was scheduled for a non-urgent echocardiogram for comparison to previous.  She was seen in the ED on  09/26/20 with chest burning and arm pain, She was ruled out for ACS and treated for GERD.    She states she received her third Covid injection on 08/17/2020.  Within 3 days afterwards she felt her heart racing, chest pressure, and left upper arm squeezing.  She ignored this as a potential sequela to immunization.  However symptoms have continued mostly occurring at nighttime.    She awakens with her heart racing chest pressure shortness of breath and left arm pain.  She gets up and goes to the bathroom lays back down and easily falls asleep.  She states this is been occurring on and off for the last 6 weeks.  This prompted ED evaluation 3 weeks ago.  She has metoprolol which she takes as needed for racing heart rate, but has not taken it.    Past Medical History:  Diagnosis Date  . Persistent atrial fibrillation ([place]) 2018   occured in the setting of urosepsis  . Renal disorder    kidney stone  . Sepsis [facility])     Past Surgical History:  Procedure Laterality Date  . DILATION AND CURETTAGE, DIAGNOSTIC / THERAPEUTIC  1990s  . EYE SURGERY       Current Outpatient Medications  Medication Sig Dispense Refill  . metoprolol succinate (TOPROL XL) 25 MG 24 hr tablet TAKE 1/2 TABLET  DAILY 45 tablet 1  . metoprolol tartrate (LOPRESSOR) 25 MG tablet Take 25 mg by mouth 2 (two) times daily as needed (palpitations).    . fesoterodine (TOVIAZ) 4 MG TB24 tablet Take 4 mg by mouth daily. (Patient not taking: Reported on 10/11/2020)     No current facility-administered medications for this visit.    Allergies:   Elemental sulfur    Social History:  The patient  reports that she has never smoked. She has never used smokeless tobacco. She reports that she does not drink alcohol and does not use drugs.   Family History:  The patient's family history includes Cancer in her father and mother; Heart disease in her mother; Hyperlipidemia in her mother; Hypertension in her mother; Stroke in her mother.    ROS: All other systems are reviewed and negative. Unless otherwise mentioned in H&P    PHYSICAL EXAM: VS:  BP 126/78   Pulse 92   Ht 5\' 5"  (1.651 m)   Wt 151 lb 3.2 oz (68.6 kg)   SpO2 97%   BMI 25.16 kg/m  , BMI Body mass index is 25.16 kg/m. GEN: Well nourished, well developed, in no acute distress HEENT: normal Neck: no JVD, carotid bruits, or masses Cardiac: RRR, tachycardic; no murmurs, rubs, or gallops,no edema  Respiratory:  Clear to auscultation bilaterally, normal work of breathing GI: soft,  nontender, nondistended, + BS MS: no deformity or atrophy Skin: warm and dry, no rash Neuro:  Strength and sensation are intact Psych: euthymic mood, full affect   EKG: Not completed this office visit.   Recent Labs: 09/26/2020: BUN 16; Creatinine, Ser 0.70; Hemoglobin 15.8; Platelets 300; Potassium 3.9; Sodium 139    Lipid Panel No results found for: CHOL, TRIG, HDL, CHOLHDL, VLDL, LDLCALC, LDLDIRECT    Wt Readings from Last 3 Encounters:  10/11/20 151 lb 3.2 oz (68.6 kg)  09/26/20 147 lb (66.7 kg)  10/05/19 162 lb 9.6 oz (73.8 kg)      Other studies Reviewed: Echocardiogram 2020/01/10 1. Left ventricular ejection fraction, by estimation, is 60 to 65%. The   left ventricle has normal function. The left ventricle has no regional  wall motion abnormalities. Left ventricular diastolic parameters are  consistent with Grade I diastolic  dysfunction (impaired relaxation). GLS -20%, normal.  2. Right ventricular systolic function is normal. The right ventricular  size is normal. There is normal pulmonary artery systolic pressure. The  estimated right ventricular systolic pressure is 10.2 mmHg.  3. The mitral valve is normal in structure. Trivial mitral valve  regurgitation. No evidence of mitral stenosis.  4. The aortic valve is tricuspid. Aortic valve regurgitation is not  visualized. Mild aortic valve sclerosis is present, with no evidence of  aortic valve stenosis.  5. The inferior vena cava is normal in size with greater than 50%  respiratory variability, suggesting right atrial pressure of 3 mmHg.    ASSESSMENT AND PLAN:  1. Chest pain: Unknown etiology currently. No FH of heart disease, does not smoke, drink ETOH or have hyperlipidemia. However, with her current symptoms, I think this warrants further evaluation with NM stress test. I have reviewed the test and explained the procedure to the patient, with the results that we are looking for and why with shared decision making it was decided to move forward with NM stress test.   2. Tachycardia: Will check TSH, T3 and T4.  She states that her mother has thyroid disease.  She denies hair loss, overly dry skin currently. I have asked her to take metoprolol 12.5 mg daily instead of prn. Can take at night if this causes fatigue.   Current medicines are reviewed at length with the patient today.  I have spent 25 mins dedicated to the care of this patient on the date of this encounter to include pre-visit review of records, assessment, management and diagnostic testing,with  Labs/ tests ordered today include: NM stress test, TSH, T4, T3/.  Phill Myron. West Pugh, ANP, [facility]   10/11/2020 9:49 AM     [facility] [place] [address] Office [phone] Fax [phone]  Notice: This dictation was prepared with Dragon dictation along with smaller phrase technology. Any transcriptional errors that result from this process are unintentional and may not be corrected upon review.

## 2020-10-07 ENCOUNTER — Telehealth: Payer: Self-pay | Admitting: Emergency Medicine

## 2020-10-07 NOTE — Telephone Encounter (Signed)
Patient called [facility] will be faxing over recent CT scan / verified our fax number at [phone]

## 2020-10-07 NOTE — Telephone Encounter (Addendum)
Noted we will look out for the scans

## 2020-10-11 ENCOUNTER — Encounter: Payer: Self-pay | Admitting: Adult Health

## 2020-10-11 ENCOUNTER — Other Ambulatory Visit: Payer: Self-pay

## 2020-10-11 ENCOUNTER — Ambulatory Visit: Payer: BC Managed Care – PPO | Admitting: Adult Health

## 2020-10-11 VITALS — BP 126/78 | HR 92 | Ht 65.0 in | Wt 151.2 lb

## 2020-10-11 DIAGNOSIS — R079 Chest pain, unspecified: Secondary | ICD-10-CM

## 2020-10-11 DIAGNOSIS — R000 Tachycardia, unspecified: Secondary | ICD-10-CM

## 2020-10-11 LAB — TSH: TSH: 1.02 u[IU]/mL (ref 0.450–4.500)

## 2020-10-11 LAB — T3: T3, Total: 119 ng/dL (ref 71–180)

## 2020-10-11 LAB — T4: T4, Total: 7.6 ug/dL (ref 4.5–12.0)

## 2020-10-11 MED ORDER — METOPROLOL SUCCINATE ER 25 MG PO TB24: ORAL_TABLET | ORAL | 1 refills | Status: DC

## 2020-10-11 NOTE — Patient Instructions (Signed)
Medication Instructions:  START METOPROLOL SUCC 25 MG 1/2 TABLET DAILY   *If you need a refill on your cardiac medications before your next appointment, please call your pharmacy*  Lab Work: TSH/T3/T4 TODAY   COVID SCREENING 3 DAYS PRIOR TO STRESS MYOVIEW   If you have labs (blood work) drawn today and your tests are completely normal, you will receive your results only by: Marland Kitchen MyChart Message (if you have MyChart) OR . A paper copy in the mail If you have any lab test that is abnormal or we need to change your treatment, we will call you to review the results.  Testing/Procedures: Your physician has requested that you have en exercise stress myoview. For further information please visit [email or web address]. Please follow instruction sheet, as given. DO NOT TAKE METOPROLOL MORNING OF STRESS TEST   Follow-Up: At [facility], you and your health needs are our priority.  As part of our continuing mission to provide you with exceptional heart care, we have created designated Provider Care Teams.  These Care Teams include your primary Cardiologist (physician) and Advanced Practice Providers (APPs -  Physician Assistants and Nurse Practitioners) who all work together to provide you with the care you need, when you need it.  We recommend signing up for the patient portal called "MyChart".  Sign up information is provided on this After Visit Summary.  MyChart is used to connect with patients for Virtual Visits (Telemedicine).  Patients are able to view lab/test results, encounter notes, upcoming appointments, etc.  Non-urgent messages can be sent to your provider as well.   To learn more about what you can do with MyChart, go to [email or web address].    Your next appointment:   2-3 WEEKS    The format for your next appointment:   In Person  Provider:   Sanda Klein, MD OR Arnold Long

## 2020-10-14 ENCOUNTER — Other Ambulatory Visit (HOSPITAL_COMMUNITY): Payer: BC Managed Care – PPO

## 2020-10-17 ENCOUNTER — Encounter (HOSPITAL_COMMUNITY): Payer: BC Managed Care – PPO

## 2020-10-21 ENCOUNTER — Other Ambulatory Visit (HOSPITAL_COMMUNITY)
Admission: RE | Admit: 2020-10-21 | Discharge: 2020-10-21 | Disposition: A | Discharge status: Home / Self Care | Payer: BC Managed Care – PPO | Source: Ambulatory Visit | Attending: Adult Health | Admitting: Adult Health

## 2020-10-21 DIAGNOSIS — Z20822 Contact with and (suspected) exposure to covid-19: Secondary | ICD-10-CM | POA: Insufficient documentation

## 2020-10-21 DIAGNOSIS — Z01812 Encounter for preprocedural laboratory examination: Secondary | ICD-10-CM | POA: Insufficient documentation

## 2020-10-21 LAB — SARS CORONAVIRUS 2 (TAT 6-24 HRS): SARS Coronavirus 2: NEGATIVE

## 2020-10-23 ENCOUNTER — Telehealth (HOSPITAL_COMMUNITY): Payer: Self-pay | Admitting: *Deleted

## 2020-10-23 NOTE — Telephone Encounter (Signed)
Close encounter 

## 2020-10-24 ENCOUNTER — Ambulatory Visit (HOSPITAL_COMMUNITY)
Admission: RE | Admit: 2020-10-24 | Discharge: 2020-10-24 | Disposition: A | Discharge status: Home / Self Care | Payer: BC Managed Care – PPO | Source: Ambulatory Visit | Attending: Cardiovascular Disease | Admitting: Cardiovascular Disease

## 2020-10-24 ENCOUNTER — Other Ambulatory Visit: Payer: Self-pay

## 2020-10-24 DIAGNOSIS — R000 Tachycardia, unspecified: Secondary | ICD-10-CM

## 2020-10-24 DIAGNOSIS — R079 Chest pain, unspecified: Secondary | ICD-10-CM

## 2020-10-24 LAB — MYOCARDIAL PERFUSION IMAGING
Estimated workload: 7 METS
Exercise duration (min): 6 min
Exercise duration (sec): 0 s
LV dias vol: 57 mL (ref 46–106)
LV sys vol: 14 mL
MPHR: 156 {beats}/min
Peak HR: 150 {beats}/min
Percent HR: 96 %
Rest HR: 94 {beats}/min
SDS: 0
SRS: 1
SSS: 1
TID: 0.94

## 2020-10-24 MED ORDER — TECHNETIUM TC 99M TETROFOSMIN IV KIT
29.1000 | PACK | Freq: Once | INTRAVENOUS | Status: AC | PRN
  Administered 2020-10-24: 29.1 via INTRAVENOUS
  Filled 2020-10-24: qty 30

## 2020-10-24 MED ORDER — TECHNETIUM TC 99M TETROFOSMIN IV KIT
10.5000 | PACK | Freq: Once | INTRAVENOUS | Status: AC | PRN
  Administered 2020-10-24: 10.5 via INTRAVENOUS
  Filled 2020-10-24: qty 11

## 2020-11-07 NOTE — Progress Notes (Signed)
Cardiology Office Note   Date:  11/08/2020   ID:  SHIREE ALTEMUS, DOB 1956/06/20, MRN [id number]  PCP:  Horald Pollen, MD  Cardiologist:  Dr.Croitouro  CC: Follow Up Atrial Fibrillation    History of Present Illness: Veronica Woods is a 65 y.o. female who presents for ongoing assessment and management of paroxysmal atrial fibrillation, this was diagnosed in 2018 after an episode of urosepsis.  She was placed on amiodarone and did convert to normal sinus rhythm at that time.She was last seen in the office by [name], [place] on 10/05/2019 for routine office visit. She was without cardiac complaints a that time. She was scheduled for a non-urgent echocardiogram for comparison to previous.  She was seen in the ED on  09/26/20 with chest burning and arm pain, She was ruled out for ACS and treated for GERD.  She states she received her third Covid injection on 08/17/2020. Within 3 days afterwards she felt her heart racing, chest pressure, and left upper arm squeezing.  She ignored this as a potential sequela to immunization.  However symptoms have continued mostly occurring at nighttime.    She awakened with her heart racing chest pressure shortness of breath and left arm pain.  She gets up and goes to the bathroom lays back down and easily falls asleep.  She states this is been occurring on and off for the last 6 weeks.  This prompted ED evaluation 3 weeks ago.  She has metoprolol which she takes as needed for racing heart rate, but has not taken it.  On last office visit dated 10/11/2020 I ordered a nuclear medicine stress test to evaluate for ischemia causing her symptoms.  For tachycardia I ordered a TSH, T3 and T4 as her mother had a history of thyroid disease.  She was to take metoprolol 12.5 mg daily instead of as needed and to take at night as it may cause some fatigue.  Nuclear medicine study was low risk with no evidence of ischemia or EKG changes.  There was no evidence of prior infarct.  She had  a normal LV function, normal exercise tolerance, and normal blood pressure response to exertion.  TSH was normal, T3 was normal, T4 was normal  She comes today feeling much better.  She is not awakening with her heart rate being rapid, or when she does awaken to go to the bathroom her heart rate does not increase when she ambulates.  She is out side more and is feeling great.  She denies fatigue.  She comes with multiple questions..    Past Medical History:  Diagnosis Date  . Persistent atrial fibrillation ([place]) 2018   occured in the setting of urosepsis  . Renal disorder    kidney stone  . Sepsis [facility])     Past Surgical History:  Procedure Laterality Date  . DILATION AND CURETTAGE, DIAGNOSTIC / THERAPEUTIC  1990s  . EYE SURGERY       Current Outpatient Medications  Medication Sig Dispense Refill  . metoprolol succinate (TOPROL XL) 25 MG 24 hr tablet TAKE 1/2 TABLET DAILY 45 tablet 1  . metoprolol tartrate (LOPRESSOR) 25 MG tablet Take 25 mg by mouth 2 (two) times daily as needed (palpitations).     No current facility-administered medications for this visit.    Allergies:   Elemental sulfur    Social History:  The patient  reports that she has never smoked. She has never used smokeless tobacco. She reports that she  does not drink alcohol and does not use drugs.   Family History:  The patient's family history includes Cancer in her father and mother; Heart disease in her mother; Hyperlipidemia in her mother; Hypertension in her mother; Stroke in her mother.    ROS: All other systems are reviewed and negative. Unless otherwise mentioned in H&P    PHYSICAL EXAM: VS:  BP 127/74   Pulse 93   Ht 5\' 5"  (1.651 m)   Wt 151 lb (68.5 kg)   SpO2 98%   BMI 25.13 kg/m  , BMI Body mass index is 25.13 kg/m. GEN: Well nourished, well developed, in no acute distress HEENT: normal Neck: no carotid bruits, or masses Cardiac: RRR; no murmurs, rubs, or gallops,no edema  Respiratory:   Clear to auscultation bilaterally, normal work of breathing GI: soft, nontender, nondistended, + BS MS: no deformity or atrophy Skin: warm and dry, no rash Neuro:  Strength and sensation are intact Psych: euthymic mood, full affect   EKG: Normal sinus rhythm, low voltage QRS, no evidence of WPW.  Heart rate of 90 bpm.  (Personally reviewed)  Recent Labs: 09/26/2020: BUN 16; Creatinine, Ser 0.70; Hemoglobin 15.8; Platelets 300; Potassium 3.9; Sodium 139 10/11/2020: TSH 1.020    Lipid Panel No results found for: CHOL, TRIG, HDL, CHOLHDL, VLDL, LDLCALC, LDLDIRECT    Wt Readings from Last 3 Encounters:  11/08/20 151 lb (68.5 kg)  10/24/20 151 lb (68.5 kg)  10/11/20 151 lb 3.2 oz (68.6 kg)      Other studies Reviewed: Echocardiogram 01/07/20 1. Left ventricular ejection fraction, by estimation, is 60 to 65%. The  left ventricle has normal function. The left ventricle has no regional  wall motion abnormalities. Left ventricular diastolic parameters are  consistent with Grade I diastolic  dysfunction (impaired relaxation). GLS -20%, normal.  2. Right ventricular systolic function is normal. The right ventricular  size is normal. There is normal pulmonary artery systolic pressure. The  estimated right ventricular systolic pressure is 70.3 mmHg.  3. The mitral valve is normal in structure. Trivial mitral valve  regurgitation. No evidence of mitral stenosis.  4. The aortic valve is tricuspid. Aortic valve regurgitation is not  visualized. Mild aortic valve sclerosis is present, with no evidence of  aortic valve stenosis.  5. The inferior vena cava is normal in size with greater than 50%  respiratory variability, suggesting right atrial pressure of 3 mmHg.     ASSESSMENT AND PLAN:  1.  Inappropriate tachycardia: Much improved with use of metoprolol 12.5 mg at at bedtime.  She is feeling better and has become more active again.  She denies any new complaints.  Continue  beta-blocker as directed.  Multiple questions asked and answered.  2.  Right atrial fibrillation: Related to urosepsis.  No further episodes.  Not on anticoagulation.  Rate control with beta-blocker, EKG normal sinus rhythm.  Current medicines are reviewed at length with the patient today.  I have spent 20 min's dedicated to the care of this patient on the date of this encounter to include pre-visit review of records, assessment, management and diagnostic testing,with shared decision making.  Labs/ tests ordered today include: None  Phill Myron. [place] Pugh, ANP, AACC   11/08/2020 1:06 PM    [facility] [address] Office [phone] Fax [phone]  Notice: This dictation was prepared with Dragon dictation along with smaller phrase technology. Any transcriptional errors that result from this process are unintentional and may not  be corrected upon review.

## 2020-11-08 ENCOUNTER — Encounter: Payer: Self-pay | Admitting: Adult Health

## 2020-11-08 ENCOUNTER — Ambulatory Visit: Payer: BC Managed Care – PPO | Admitting: Adult Health

## 2020-11-08 ENCOUNTER — Other Ambulatory Visit: Payer: Self-pay

## 2020-11-08 VITALS — BP 127/74 | HR 93 | Ht 65.0 in | Wt 151.0 lb

## 2020-11-08 DIAGNOSIS — I480 Paroxysmal atrial fibrillation: Secondary | ICD-10-CM | POA: Diagnosis not present

## 2020-11-08 NOTE — Progress Notes (Signed)
Thank you :)

## 2020-11-08 NOTE — Patient Instructions (Addendum)

## 2021-02-04 ENCOUNTER — Encounter (INDEPENDENT_AMBULATORY_CARE_PROVIDER_SITE_OTHER): Payer: Self-pay | Admitting: Ophthalmology

## 2021-02-04 ENCOUNTER — Other Ambulatory Visit: Payer: Self-pay

## 2021-02-04 ENCOUNTER — Ambulatory Visit (INDEPENDENT_AMBULATORY_CARE_PROVIDER_SITE_OTHER): Payer: BC Managed Care – PPO | Admitting: Ophthalmology

## 2021-02-04 DIAGNOSIS — H43813 Vitreous degeneration, bilateral: Secondary | ICD-10-CM | POA: Diagnosis not present

## 2021-02-04 DIAGNOSIS — H33312 Horseshoe tear of retina without detachment, left eye: Secondary | ICD-10-CM

## 2021-02-04 DIAGNOSIS — D3132 Benign neoplasm of left choroid: Secondary | ICD-10-CM | POA: Diagnosis not present

## 2021-02-04 DIAGNOSIS — H2513 Age-related nuclear cataract, bilateral: Secondary | ICD-10-CM | POA: Diagnosis not present

## 2021-02-04 NOTE — Assessment & Plan Note (Signed)
OS, stable over time no interval change stable size and appearance for the last 5 years photo documented, and clinical exam

## 2021-02-04 NOTE — Progress Notes (Signed)
02/04/2021     CHIEF COMPLAINT Patient presents for Retina Follow Up (1 year fu ou and fp/Pt states VA OU stable since last visit. Pt denies FOL, floaters, or ocular pain OU. /)   HISTORY OF PRESENT ILLNESS: Veronica Woods is a 65 y.o. female who presents to the clinic today for:   HPI    Retina Follow Up    Diagnosis: PVD   Laterality: both eyes   Onset: 1 year ago   Severity: mild   Duration: 1 year   Course: stable   Comments: 1 year fu ou and fp Pt states VA OU stable since last visit. Pt denies FOL, floaters, or ocular pain OU.         Last edited by Kendra Opitz, COA on 02/04/2021  8:13 AM. (History)      Referring physician: Horald Pollen, [place],  [place] [address]  HISTORICAL INFORMATION:   Selected notes from the [facility]: No current outpatient medications on file. (Ophthalmic Drugs)   No current facility-administered medications for this visit. (Ophthalmic Drugs)   Current Outpatient Medications (Other)  Medication Sig  . metoprolol succinate (TOPROL XL) 25 MG 24 hr tablet TAKE 1/2 TABLET DAILY  . metoprolol tartrate (LOPRESSOR) 25 MG tablet Take 25 mg by mouth 2 (two) times daily as needed (palpitations).   No current facility-administered medications for this visit. (Other)      REVIEW OF SYSTEMS:    ALLERGIES Allergies  Allergen Reactions  . Elemental Sulfur Anaphylaxis and Other (See Comments)    Childhood reaction    PAST MEDICAL HISTORY Past Medical History:  Diagnosis Date  . Persistent atrial fibrillation ([place]) 2018   occured in the setting of urosepsis  . Renal disorder    kidney stone  . Sepsis [facility])    Past Surgical History:  Procedure Laterality Date  . DILATION AND CURETTAGE, DIAGNOSTIC / THERAPEUTIC  1990s  . EYE SURGERY      FAMILY HISTORY Family History  Problem Relation Age of Onset  . Cancer Mother   . Heart disease Mother   .  Hyperlipidemia Mother   . Hypertension Mother   . Stroke Mother   . Cancer Father     SOCIAL HISTORY Social History   Tobacco Use  . Smoking status: Never Smoker  . Smokeless tobacco: Never Used  Vaping Use  . Vaping Use: Never used  Substance Use Topics  . Alcohol use: No    Alcohol/week: 0.0 standard drinks  . Drug use: No         OPHTHALMIC EXAM:  Base Eye Exam    Visual Acuity (ETDRS)      Right Left   Dist cc 20/60 -1 20/400   Dist ph cc 20/40 +2 20/40 -1   Correction: Glasses       Tonometry (Tonopen, 8:17 AM)      Right Left   Pressure 22 22       Pupils      Pupils Dark Light Shape React APD   Right PERRL 4 3 Round Brisk None   Left PERRL 4 3 Round Brisk None       Visual Fields (Counting fingers)      Left Right    Full Full       Extraocular Movement      Right Left    Full Full  Neuro/Psych    Oriented x3: Yes   Mood/Affect: Normal       Dilation    Both eyes: 1.0% Mydriacyl, 2.5% Phenylephrine @ 8:17 AM        Slit Lamp and Fundus Exam    External Exam      Right Left   External Normal Normal       Slit Lamp Exam      Right Left   Lids/Lashes Normal Normal   Conjunctiva/Sclera White and quiet White and quiet   Cornea Clear Clear   Anterior Chamber Deep and quiet Deep and quiet   Iris Round and reactive Round and reactive   Lens 1+ Nuclear sclerosis 1+ Nuclear sclerosis   Anterior Vitreous Normal Normal       Fundus Exam      Right Left   Posterior Vitreous Posterior vitreous detachment Posterior vitreous detachment   Disc  Normal   C/D Ratio 0.45 0.4   Macula Normal Normal   Vessels Normal Normal   Periphery Normal Good laser retinopexy inferotemporal round break at 4:00 meridian.Inferonasally:Incidental flat choroidal nevus 1.2 disc. Diameter,  inferonasal quadrant near the equator, no fluid, no atrophy, no lipofuscin, no thickness          IMAGING AND PROCEDURES  Imaging and Procedures for  02/04/21  Color Fundus Photography Optos - OU - Both Eyes       Right Eye Progression has been stable. Disc findings include normal observations, increased cup to disc ratio. Macula : normal observations. Vessels : normal observations. Periphery : normal observations.   Left Eye Progression has been stable. Disc findings include normal observations. Macula : normal observations. Vessels : normal observations.   Notes Incidental posterior vitreous detachment OU  Choroidal nevus inferonasal to the nerve near the equator left eye no interval change in size, 1.25 disc diameters, flat no atrophy no lipofuscin no fluid no thickness                ASSESSMENT/PLAN:  Choroidal nevus, left eye OS, stable over time no interval change stable size and appearance for the last 5 years photo documented, and clinical exam  Horseshoe tear of retina of left eye without detachment No new holes or breaks OU  Posterior vitreous detachment of both eyes  The nature of posterior vitreous detachment was discussed with the patient as well as its physiology, its age prevalence, and its possible implication regarding retinal breaks and detachment.  An informational brochure was offered to the patient.  All the patient's questions were answered.  The patient was asked to return if new or different flashes or floaters develops.   Patient was instructed to contact office immediately if any new changes were noticed. I explained to the patient that vitreous inside the eye is similar to jello inside a bowl. As the jello melts it can start to pull away from the bowl, similarly the vitreous throughout our lives can begin to pull away from the retina. That process is called a posterior vitreous detachment. In some cases, the vitreous can tug hard enough on the retina to form a retinal tear. I discussed with the patient the signs and symptoms of a retinal detachment.  Do not rub the eye.    Nuclear sclerotic  cataract of both eyes Minor yellow-green color, no opacity no impact on acuity at this time      ICD-10-CM   1. Posterior vitreous detachment of both eyes  H43.813 Color Fundus Photography Optos -  OU - Both Eyes  2. Horseshoe tear of retina of left eye without detachment  H33.312   3. Choroidal nevus, left eye  D31.32   4. Nuclear sclerotic cataract of both eyes  H25.13     1.  OU with posterior vitreous detachment.  No new holes retinal tears or breaks.  2.  OS with choroidal nevus No interval change in size or appearance of the last 5 years.  3.  Ophthalmic Meds Ordered this visit:  No orders of the defined types were placed in this encounter.      Return in about 1 year (around 02/04/2022) for DILATE OU, OCT, COLOR FP.  There are no Patient Instructions on file for this visit.   Explained the diagnoses, plan, and follow up with the patient and they expressed understanding.  Patient expressed understanding of the importance of proper follow up care.   Clent Demark Tashea Othman M.D. Diseases & Surgery of the Retina and Vitreous [facility]ina [facility] [place] 02/04/21     Abbreviations: M myopia (nearsighted); A astigmatism; H hyperopia (farsighted); P presbyopia; Mrx spectacle prescription;  CTL contact lenses; OD right eye; OS left eye; OU both eyes  XT exotropia; ET esotropia; PEK punctate epithelial keratitis; PEE punctate epithelial erosions; DES dry eye syndrome; MGD meibomian gland dysfunction; ATs artificial tears; PFAT's preservative free artificial tears; [place] nuclear sclerotic cataract; PSC posterior subcapsular cataract; ERM epi-retinal membrane; PVD posterior vitreous detachment; RD retinal detachment; DM diabetes mellitus; DR diabetic retinopathy; NPDR non-proliferative diabetic retinopathy; PDR proliferative diabetic retinopathy; CSME clinically significant macular edema; DME diabetic macular edema; dbh dot blot hemorrhages; CWS cotton wool spot; POAG primary open angle  glaucoma; C/D cup-to-disc ratio; HVF humphrey visual field; GVF goldmann visual field; OCT optical coherence tomography; IOP intraocular pressure; BRVO Branch retinal vein occlusion; CRVO central retinal vein occlusion; CRAO central retinal artery occlusion; BRAO branch retinal artery occlusion; RT retinal tear; SB scleral buckle; PPV pars plana vitrectomy; VH Vitreous hemorrhage; PRP panretinal laser photocoagulation; IVK intravitreal kenalog; VMT vitreomacular traction; MH Macular hole;  NVD neovascularization of the disc; NVE neovascularization elsewhere; AREDS age related eye disease study; ARMD age related macular degeneration; POAG primary open angle glaucoma; EBMD epithelial/anterior basement membrane dystrophy; ACIOL anterior chamber intraocular lens; IOL intraocular lens; PCIOL posterior chamber intraocular lens; Phaco/IOL phacoemulsification with intraocular lens placement; [place] photorefractive keratectomy; LASIK laser assisted in situ keratomileusis; HTN hypertension; DM diabetes mellitus; COPD chronic obstructive pulmonary disease

## 2021-02-04 NOTE — Assessment & Plan Note (Signed)
Minor yellow-green color, no opacity no impact on acuity at this time

## 2021-02-04 NOTE — Assessment & Plan Note (Signed)

## 2021-02-04 NOTE — Assessment & Plan Note (Signed)
No new holes or breaks OU

## 2021-03-18 ENCOUNTER — Other Ambulatory Visit: Payer: Self-pay | Admitting: Adult Health

## 2021-04-07 ENCOUNTER — Telehealth: Payer: Self-pay | Admitting: Cardiovascular Disease

## 2021-04-07 MED ORDER — METOPROLOL SUCCINATE ER 25 MG PO TB24: ORAL_TABLET | ORAL | 1 refills | Status: DC

## 2021-04-07 NOTE — Telephone Encounter (Signed)
*  STAT* If patient is at the pharmacy, call can be transferred to refill team.   1. Which medications need to be refilled? (please list name of each medication and dose if known)  metoprolol succinate (TOPROL-XL) 25 MG 24 hr tablet   2. Which pharmacy/location (including street and city if local pharmacy) is medication to be sent to? [facility]  3. Do they need a 30 day or 90 day supply?  90 day supply

## 2021-09-11 ENCOUNTER — Other Ambulatory Visit: Payer: Self-pay | Admitting: Cardiovascular Disease

## 2022-02-10 ENCOUNTER — Ambulatory Visit (INDEPENDENT_AMBULATORY_CARE_PROVIDER_SITE_OTHER): Payer: Medicare Other | Admitting: Ophthalmology

## 2022-02-10 ENCOUNTER — Encounter (INDEPENDENT_AMBULATORY_CARE_PROVIDER_SITE_OTHER): Payer: Self-pay | Admitting: Ophthalmology

## 2022-02-10 DIAGNOSIS — H2513 Age-related nuclear cataract, bilateral: Secondary | ICD-10-CM

## 2022-02-10 DIAGNOSIS — D3132 Benign neoplasm of left choroid: Secondary | ICD-10-CM

## 2022-02-10 DIAGNOSIS — H33312 Horseshoe tear of retina without detachment, left eye: Secondary | ICD-10-CM | POA: Diagnosis not present

## 2022-02-10 DIAGNOSIS — H43813 Vitreous degeneration, bilateral: Secondary | ICD-10-CM

## 2022-02-10 NOTE — Assessment & Plan Note (Signed)
Incidental.

## 2022-02-10 NOTE — Assessment & Plan Note (Addendum)
OS stable over time with no new breaks

## 2022-02-10 NOTE — Assessment & Plan Note (Signed)
Year-over-year no change in size orientation or features.  No high risk features.

## 2022-02-10 NOTE — Assessment & Plan Note (Signed)
Moderate OU 

## 2022-02-10 NOTE — Progress Notes (Signed)
02/10/2022     CHIEF COMPLAINT Patient presents for  Chief Complaint  Patient presents with   Retina Follow Up      HISTORY OF PRESENT ILLNESS: Veronica Woods is a 66 y.o. female who presents to the clinic today for:   HPI     Retina Follow Up           Diagnosis: Other (PVD)   Laterality: both eyes   Onset: 1 year ago         Comments   1 yr fu OU OCT FP. Patient states vision is stable and unchanged since last visit. Denies any new floaters or FOL.       Last edited by Laurin Coder on 02/10/2022  8:20 AM.      Referring physician: Monna Fam, MD [place],  [place] [address]  HISTORICAL INFORMATION:   Selected notes from the MEDICAL RECORD NUMBER       CURRENT MEDICATIONS: No current outpatient medications on file. (Ophthalmic Drugs)   No current facility-administered medications for this visit. (Ophthalmic Drugs)   Current Outpatient Medications (Other)  Medication Sig   metoprolol succinate (TOPROL-XL) 25 MG 24 hr tablet TAKE ONE-HALF TABLET BY MOUTH EVERY DAY. GENERIC EQUIVALENT FOR TOPROL XL   metoprolol tartrate (LOPRESSOR) 25 MG tablet Take 25 mg by mouth 2 (two) times daily as needed (palpitations).   No current facility-administered medications for this visit. (Other)      REVIEW OF SYSTEMS: ROS   Negative for: Constitutional, Gastrointestinal, Neurological, Skin, Genitourinary, Musculoskeletal, HENT, Endocrine, Cardiovascular, Eyes, Respiratory, Psychiatric, Allergic/Imm, Heme/Lymph Last edited by Laurin Coder on 02/10/2022  8:20 AM.       ALLERGIES Allergies  Allergen Reactions   Elemental Sulfur Anaphylaxis and Other (See Comments)    Childhood reaction    PAST MEDICAL HISTORY Past Medical History:  Diagnosis Date   Persistent atrial fibrillation ([place]) 2018   occured in the setting of urosepsis   Renal disorder    kidney stone   Sepsis ([place])    Past Surgical History:  Procedure Laterality  Date   DILATION AND CURETTAGE, DIAGNOSTIC / THERAPEUTIC  1990s   EYE SURGERY      FAMILY HISTORY Family History  Problem Relation Age of Onset   Cancer Mother    Heart disease Mother    Hyperlipidemia Mother    Hypertension Mother    Stroke Mother    Cancer Father     SOCIAL HISTORY Social History   Tobacco Use   Smoking status: Never   Smokeless tobacco: Never  Vaping Use   Vaping Use: Never used  Substance Use Topics   Alcohol use: No    Alcohol/week: 0.0 standard drinks of alcohol   Drug use: No         OPHTHALMIC EXAM:  Base Eye Exam     Visual Acuity (ETDRS)       Right Left   Dist cc 20/40 20/125   Dist ph cc 20/30 20/40    Correction: Glasses         Tonometry (Tonopen, 8:13 AM)       Right Left   Pressure 26 17         Tonometry #2 (Tonopen, 8:14 AM)       Right Left   Pressure 27          Pupils       Pupils Dark Light APD   Right PERRL 4 3  None   Left PERRL 4 3 None         Visual Fields (Counting fingers)       Left Right    Full Full         Extraocular Movement       Right Left    Full Full         Neuro/Psych     Oriented x3: Yes   Mood/Affect: Normal         Dilation     Both eyes: 1.0% Mydriacyl, 2.5% Phenylephrine @ 8:14 AM           Slit Lamp and Fundus Exam     External Exam       Right Left   External Normal Normal         Slit Lamp Exam       Right Left   Lids/Lashes Normal Normal   Conjunctiva/Sclera White and quiet White and quiet   Cornea Clear Clear   Anterior Chamber Deep and quiet Deep and quiet   Iris Round and reactive Round and reactive   Lens 1+ Nuclear sclerosis 1+ Nuclear sclerosis   Anterior Vitreous Normal Normal         Fundus Exam       Right Left   Posterior Vitreous Posterior vitreous detachment Posterior vitreous detachment   Disc  Normal   C/D Ratio 0.45 0.4   Macula Normal Normal   Vessels Normal Normal   Periphery Normal Good laser  retinopexy inferotemporal round break at 4:00 meridian.Inferonasally:Incidental flat choroidal nevus 1.2 disc. Diameter, inferonasal quadrant near the equator, no fluid, no atrophy, no lipofuscin, no thickness            IMAGING AND PROCEDURES  Imaging and Procedures for 02/10/22  OCT, Retina - OU - Both Eyes       Right Eye Quality was good. Scan locations included subfoveal. Central Foveal Thickness: 279. Progression has been stable. Findings include normal observations.   Left Eye Quality was good. Scan locations included subfoveal. Central Foveal Thickness: 299. Progression has been stable. Findings include normal observations.   Notes OS, incidental posterior vitreous detachment,      Color Fundus Photography Optos - OU - Both Eyes       Right Eye Progression has been stable. Disc findings include normal observations, increased cup to disc ratio. Macula : normal observations. Vessels : normal observations. Periphery : normal observations.   Left Eye Progression has been stable. Disc findings include normal observations. Macula : normal observations. Vessels : normal observations.   Notes Incidental posterior vitreous detachment OU  Choroidal nevus inferonasal to the nerve near the equator left eye no interval change in size, 1.25 disc diameters, flat no atrophy no lipofuscin no fluid no thickness  Good retinopexy inferotemporal around retinal break.  No new breaks clear media              ASSESSMENT/PLAN:  Choroidal nevus, left eye Year-over-year no change in size orientation or features.  No high risk features.  Horseshoe tear of retina of left eye without detachment OS stable over time with no new breaks  Nuclear sclerotic cataract of both eyes Moderate OU  Posterior vitreous detachment of both eyes Incidental.     ICD-10-CM   1. Posterior vitreous detachment of both eyes  H43.813 OCT, Retina - OU - Both Eyes    Color Fundus Photography Optos  - OU - Both Eyes    2. Horseshoe tear  of retina of left eye without detachment  H33.312 OCT, Retina - OU - Both Eyes    Color Fundus Photography Optos - OU - Both Eyes    3. Choroidal nevus, left eye  D31.32     4. Nuclear sclerotic cataract of both eyes  H25.13       1.  OS with small choroidal nevus inferonasal, near equator, change over time year-over-year  2.  OS also with old retinal break, good retinopexy temporally.  3.  Moderate NSC changes follow-up with [facility] as scheduled  Ophthalmic Meds Ordered this visit:  No orders of the defined types were placed in this encounter.      No follow-ups on file.  There are no Patient Instructions on file for this visit.   Explained the diagnoses, plan, and follow up with the patient and they expressed understanding.  Patient expressed understanding of the importance of proper follow up care.   Clent Demark Velmer Woelfel M.D. Diseases & Surgery of the Retina and Vitreous [facility] [place] 02/10/22     Abbreviations: M myopia (nearsighted); A astigmatism; H hyperopia (farsighted); P presbyopia; Mrx spectacle prescription;  CTL contact lenses; OD right eye; OS left eye; OU both eyes  XT exotropia; ET esotropia; PEK punctate epithelial keratitis; PEE punctate epithelial erosions; DES dry eye syndrome; MGD meibomian gland dysfunction; ATs artificial tears; PFAT's preservative free artificial tears; [place] nuclear sclerotic cataract; PSC posterior subcapsular cataract; ERM epi-retinal membrane; PVD posterior vitreous detachment; RD retinal detachment; DM diabetes mellitus; DR diabetic retinopathy; NPDR non-proliferative diabetic retinopathy; PDR proliferative diabetic retinopathy; CSME clinically significant macular edema; DME diabetic macular edema; dbh dot blot hemorrhages; CWS cotton wool spot; POAG primary open angle glaucoma; C/D cup-to-disc ratio; HVF humphrey visual field; GVF goldmann visual field; OCT optical coherence  tomography; IOP intraocular pressure; BRVO Branch retinal vein occlusion; CRVO central retinal vein occlusion; CRAO central retinal artery occlusion; BRAO branch retinal artery occlusion; RT retinal tear; SB scleral buckle; PPV pars plana vitrectomy; VH Vitreous hemorrhage; PRP panretinal laser photocoagulation; IVK intravitreal kenalog; VMT vitreomacular traction; MH Macular hole;  NVD neovascularization of the disc; NVE neovascularization elsewhere; AREDS age related eye disease study; ARMD age related macular degeneration; POAG primary open angle glaucoma; EBMD epithelial/anterior basement membrane dystrophy; ACIOL anterior chamber intraocular lens; IOL intraocular lens; PCIOL posterior chamber intraocular lens; Phaco/IOL phacoemulsification with intraocular lens placement; [place] photorefractive keratectomy; LASIK laser assisted in situ keratomileusis; HTN hypertension; DM diabetes mellitus; COPD chronic obstructive pulmonary disease

## 2022-03-20 ENCOUNTER — Encounter (INDEPENDENT_AMBULATORY_CARE_PROVIDER_SITE_OTHER): Payer: Self-pay

## 2022-03-20 DIAGNOSIS — H40013 Open angle with borderline findings, low risk, bilateral: Secondary | ICD-10-CM | POA: Diagnosis not present

## 2022-03-20 DIAGNOSIS — H33312 Horseshoe tear of retina without detachment, left eye: Secondary | ICD-10-CM | POA: Diagnosis not present

## 2022-03-20 DIAGNOSIS — H25813 Combined forms of age-related cataract, bilateral: Secondary | ICD-10-CM | POA: Diagnosis not present

## 2022-03-20 DIAGNOSIS — H43813 Vitreous degeneration, bilateral: Secondary | ICD-10-CM | POA: Diagnosis not present

## 2022-04-01 DIAGNOSIS — H524 Presbyopia: Secondary | ICD-10-CM | POA: Diagnosis not present

## 2022-04-09 ENCOUNTER — Other Ambulatory Visit: Payer: Self-pay | Admitting: Cardiovascular Disease

## 2022-04-17 NOTE — Progress Notes (Signed)
Cardiology Clinic Note   Patient Name: Veronica Woods Date of Encounter: 04/24/2022  Primary Care Provider:  Horald Pollen, MD Primary Cardiologist:  Sanda Klein, MD  Patient Profile    66 year old female patient with history of paroxysmal atrial fibrillation diagnosed in 2018, placed on amiodarone and converted to normal sinus rhythm.  History of GERD, palpitations with racing heart, rate.  Nuclear medicine study 2022 was negative for ischemia or EKG changes.  Normal LV function and normal exercise tolerance.  TSH was normal.  She was placed on low-dose metoprolol at that office visit and on return visit was feeling much better and had no significant recurrence of rapid heart rhythm.  Past Medical History    Past Medical History:  Diagnosis Date   Persistent atrial fibrillation ([place]) 2018   occured in the setting of urosepsis   Renal disorder    kidney stone   Sepsis [facility])    Past Surgical History:  Procedure Laterality Date   DILATION AND CURETTAGE, DIAGNOSTIC / THERAPEUTIC  1990s   EYE SURGERY      Allergies  Allergies  Allergen Reactions   Elemental Sulfur Anaphylaxis and Other (See Comments)    Childhood reaction    History of Present Illness    Veronica Woods is a very pleasant 66 year old patient who returns today for ongoing assessment and management of PAF, with pharmacologic cardioversion with amiodarone in 2018. Rate controlled on metoprolol on last visit. No longer on anticoagulation. She states that she has continued to have occasional elevated heart rates first thing in the morning or if she awakens in the middle of the night to use the bathroom.  Usually resolves in about 10 minutes. She reports that she has not had to take any extra doses of metoprolol for rapid HR that are sustained.   She is followed by PCP for her labs and is due to see them in the next couple of months. She remains active taking care of her 54-monthold grandson. She denies dizziness,  chest pain or shortness of breath with elevated HR, or during the day when she is active. She reports that she cannot tell that her HR is up during the day with activity.   Home Medications    Current Outpatient Medications  Medication Sig Dispense Refill   metoprolol succinate (TOPROL-XL) 25 MG 24 hr tablet TAKE ONE-HALF TABLET BY MOUTH EVERY DAY GENERIC EQUIVALENT FOR TOPROL XL SCHEDULE APPOINTMENT FOR FUTURE REFILLS 90 tablet 3   No current facility-administered medications for this visit.     Family History    Family History  Problem Relation Age of Onset   Cancer Mother    Heart disease Mother    Hyperlipidemia Mother    Hypertension Mother    Stroke Mother    Cancer Father    She indicated that her mother is alive. She indicated that her father is deceased. She indicated that her sister is alive. She indicated that her maternal grandmother is deceased. She indicated that her maternal grandfather is deceased. She indicated that her paternal grandmother is deceased. She indicated that her paternal grandfather is deceased.  Social History    Social History   Socioeconomic History   Marital status: Married    Spouse name: Not on file   Number of children: Not on file   Years of education: Not on file   Highest education level: Not on file  Occupational History   Not on file  Tobacco Use  Smoking status: Never   Smokeless tobacco: Never  Vaping Use   Vaping Use: Never used  Substance and Sexual Activity   Alcohol use: No    Alcohol/week: 0.0 standard drinks of alcohol   Drug use: No   Sexual activity: Not on file  Other Topics Concern   Not on file  Social History Narrative   Not on file   Social Determinants of Health   Financial Resource Strain: Not on file  Food Insecurity: Not on file  Transportation Needs: Not on file  Physical Activity: Not on file  Stress: Not on file  Social Connections: Not on file  Intimate Partner Violence: Not on file      Review of Systems    General:  No chills, fever, night sweats or weight changes.  Cardiovascular:  No chest pain, dyspnea on exertion, edema, orthopnea, postive for elevated HR early morning or when she awakens at night, palpitations, paroxysmal nocturnal dyspnea. Dermatological: No rash, lesions/masses Respiratory: No cough, dyspnea Urologic: No hematuria, dysuria Abdominal:   No nausea, vomiting, diarrhea, bright red blood per rectum, melena, or hematemesis Neurologic:  No visual changes, wkns, changes in mental status. All other systems reviewed and are otherwise negative except as noted above.     Physical Exam    VS:  BP 118/70   Pulse 97   Ht '5\' 5"'$  (1.651 m)   Wt 155 lb 6.4 oz (70.5 kg)   SpO2 98%   BMI 25.86 kg/m  , BMI Body mass index is 25.86 kg/m.     GEN: Well nourished, well developed, in no acute distress. HEENT: normal. Neck: Supple, no JVD, carotid bruits, or masses. Cardiac: RRR, no murmurs, rubs, or gallops. No clubbing, cyanosis, edema.  Radials/DP/PT 2+ and equal bilaterally.  Respiratory:  Respirations regular and unlabored, clear to auscultation bilaterally. GI: Soft, nontender, nondistended, BS + x 4. MS: no deformity or atrophy. Skin: warm and dry, no rash. Neuro:  Strength and sensation are intact. Psych: Normal affect.  Accessory Clinical Findings    ECG personally reviewed by me today- Sinus tachycardia, rate of 97 bpm, incomplete RBBB. - No acute changes when compared to 11/08/2021 EKG.  Lab Results  Component Value Date   WBC 5.7 09/26/2020   HGB 15.8 (H) 09/26/2020   HCT 46.4 (H) 09/26/2020   MCV 89.9 09/26/2020   PLT 300 09/26/2020   Lab Results  Component Value Date   CREATININE 0.70 09/26/2020   BUN 16 09/26/2020   NA 139 09/26/2020   K 3.9 09/26/2020   CL 107 09/26/2020   CO2 25 09/26/2020   Lab Results  Component Value Date   ALT 28 06/25/2017   AST 43 (H) 06/25/2017   ALKPHOS 150 (H) 06/25/2017   BILITOT 2.2 (H)  06/25/2017   No results found for: "CHOL", "HDL", "LDLCALC", "LDLDIRECT", "TRIG", "CHOLHDL"  No results found for: "HGBA1C"  Review of Prior Studies: Echocardiogram 01/01/2020 1. Left ventricular ejection fraction, by estimation, is 60 to 65%. The  left ventricle has normal function. The left ventricle has no regional  wall motion abnormalities. Left ventricular diastolic parameters are  consistent with Grade I diastolic  dysfunction (impaired relaxation). GLS -20%, normal.   2. Right ventricular systolic function is normal. The right ventricular  size is normal. There is normal pulmonary artery systolic pressure. The  estimated right ventricular systolic pressure is 60.4 mmHg.   3. The mitral valve is normal in structure. Trivial mitral valve  regurgitation. No evidence of  mitral stenosis.   4. The aortic valve is tricuspid. Aortic valve regurgitation is not  visualized. Mild aortic valve sclerosis is present, with no evidence of  aortic valve stenosis.   5. The inferior vena cava is normal in size with greater than 50%  respiratory variability, suggesting right atrial pressure of 3 mmHg.     Assessment & Plan   1.  PAF: She had not had any incidences of PAF since 2020 after COVID vaccine. She continues to have some elevation in her HR when she first awakens in the morning or when she gets up to use the bathroom at night.  She has not had to take any extra doses of metoprolol for sustained heart rate elevation.  Will continue watchful waiting. I have advised her to drink more water and to avoid excessive caffeine intake. If she takes extra doses of metoprolol for sustained HR elevation will need to increase her metoprolol to 25 mg XL at HS or have her take metoprolol 12.5 mg BID. She verbalizes understanding. Would like to check TSH, and Lipid panel but she is due for labs in a couple of months with PCP.    Current medicines are reviewed at length with the patient today.  I have spent  25 min's  dedicated to the care of this patient on the date of this encounter to include pre-visit review of records, assessment, management and diagnostic testing,with shared decision making.   Signed, Phill Myron. West Pugh, ANP, AACC   04/24/2022 8:29 AM      Office [phone] Fax [phone]  Notice: This dictation was prepared with Dragon dictation along with smaller phrase technology. Any transcriptional errors that result from this process are unintentional and may not be corrected upon review.

## 2022-04-24 ENCOUNTER — Encounter: Payer: Self-pay | Admitting: Adult Health

## 2022-04-24 ENCOUNTER — Ambulatory Visit (INDEPENDENT_AMBULATORY_CARE_PROVIDER_SITE_OTHER): Payer: Medicare Other | Admitting: Adult Health

## 2022-04-24 VITALS — BP 118/70 | HR 97 | Ht 65.0 in | Wt 155.4 lb

## 2022-04-24 DIAGNOSIS — I480 Paroxysmal atrial fibrillation: Secondary | ICD-10-CM

## 2022-04-24 DIAGNOSIS — R000 Tachycardia, unspecified: Secondary | ICD-10-CM

## 2022-04-24 MED ORDER — METOPROLOL SUCCINATE ER 25 MG PO TB24
ORAL_TABLET | ORAL | 3 refills | Status: DC
Start: 2022-04-24 — End: 2023-02-18

## 2022-04-24 NOTE — Patient Instructions (Signed)
Medication Instructions:  Your physician recommends that you continue on your current medications as directed. Please refer to the Current Medication list given to you today.  *If you need a refill on your cardiac medications before your next appointment, please call your pharmacy*   Lab Work: NONE If you have labs (blood work) drawn today and your tests are completely normal, you will receive your results only by: [place] (if you have MyChart) OR A paper copy in the mail If you have any lab test that is abnormal or we need to change your treatment, we will call you to review the results.   Testing/Procedures: NONE   Follow-Up: At [facility], you and your health needs are our priority.  As part of our continuing mission to provide you with exceptional heart care, we have created designated Provider Care Teams.  These Care Teams include your primary Cardiologist (physician) and Advanced Practice Providers (APPs -  Physician Assistants and Nurse Practitioners) who all work together to provide you with the care you need, when you need it.  We recommend signing up for the patient portal called "MyChart".  Sign up information is provided on this After Visit Summary.  MyChart is used to connect with patients for Virtual Visits (Telemedicine).  Patients are able to view lab/test results, encounter notes, upcoming appointments, etc.  Non-urgent messages can be sent to your provider as well.   To learn more about what you can do with MyChart, go to [email or web address].    Your next appointment:   6 month(s)  The format for your next appointment:   In Person  Provider:   Sanda Klein, MD

## 2022-05-25 DIAGNOSIS — H40013 Open angle with borderline findings, low risk, bilateral: Secondary | ICD-10-CM | POA: Diagnosis not present

## 2022-05-25 DIAGNOSIS — H18603 Keratoconus, unspecified, bilateral: Secondary | ICD-10-CM | POA: Diagnosis not present

## 2022-06-15 ENCOUNTER — Telehealth: Payer: Self-pay

## 2022-06-15 NOTE — Telephone Encounter (Signed)
Nurse Assessment Nurse: Zenia Resides, RN, Diane Date/Time Eilene Ghazi Time): 06/06/2022 10:39:46 AM Confirm and document reason for call. If symptomatic, describe symptoms. ---Caller states pt. is COVID positive. She is sob on exertion. Current HR 108, Temp 101.4, bad cough- no sputum, chills and and a headache. Symptoms started last night. Does the patient have any new or worsening symptoms? ---Yes Will a triage be completed? ---Yes Related visit to physician within the last 2 weeks? ---No Does the PT have any chronic conditions? (i.e. diabetes, asthma, this includes High risk factors for pregnancy, etc.) ---Yes List chronic conditions. ---tachycardia Is this a behavioral health or substance abuse call? ---No Guidelines Guideline Title Affirmed Question Affirmed Notes Nurse Date/Time (Eastern Time) COVID-19 - Diagnosed or Suspected MILD difficulty breathing (e.g., minimal/no SOB at rest, SOB with walking, pulse <100)  Disp. Time Eilene Ghazi Time) Disposition Final User 06/06/2022 10:38:27 AM Send to Urgent Queue Vena Rua 06/06/2022 10:45:02 AM See HCP within 4 Hours (or PCP triage) Yes Zenia Resides, RN, Diane Final Disposition 06/06/2022 10:45:02 AM See HCP within 4 Hours (or PCP triage) Yes Zenia Resides, RN, Diane Caller Disagree/Comply Comply Caller Understands Yes PreDisposition Did not know what to do Care Advice Given Per Guideline SEE HCP (OR PCP TRIAGE) WITHIN 4 HOURS: * IF OFFICE WILL BE CLOSED AND NO PCP (PRIMARY CARE PROVIDER) SECOND-LEVEL TRIAGE: You need to be seen within the next 3 or 4 hours. A nearby Urgent Care Center [facility]) is often a good source of care. Another choice is to go to the ED. Go sooner if you become worse. * You become worse CALL BACK IF: CARE ADVICE given per COVID-19 - DIAGNOSED OR SUSPECTED (Adult) guideline. Referrals [facility] [place]

## 2022-06-22 DIAGNOSIS — H40012 Open angle with borderline findings, low risk, left eye: Secondary | ICD-10-CM | POA: Diagnosis not present

## 2022-08-10 DIAGNOSIS — H40011 Open angle with borderline findings, low risk, right eye: Secondary | ICD-10-CM | POA: Diagnosis not present

## 2022-08-13 DIAGNOSIS — H5213 Myopia, bilateral: Secondary | ICD-10-CM | POA: Diagnosis not present

## 2022-09-01 DIAGNOSIS — H524 Presbyopia: Secondary | ICD-10-CM | POA: Diagnosis not present

## 2022-09-08 DIAGNOSIS — H40013 Open angle with borderline findings, low risk, bilateral: Secondary | ICD-10-CM | POA: Diagnosis not present

## 2022-09-09 DIAGNOSIS — Z8669 Personal history of other diseases of the nervous system and sense organs: Secondary | ICD-10-CM | POA: Diagnosis not present

## 2022-09-09 DIAGNOSIS — H40003 Preglaucoma, unspecified, bilateral: Secondary | ICD-10-CM | POA: Diagnosis not present

## 2022-09-09 DIAGNOSIS — H18603 Keratoconus, unspecified, bilateral: Secondary | ICD-10-CM | POA: Diagnosis not present

## 2022-09-09 DIAGNOSIS — H2513 Age-related nuclear cataract, bilateral: Secondary | ICD-10-CM | POA: Diagnosis not present

## 2022-11-26 DIAGNOSIS — H401123 Primary open-angle glaucoma, left eye, severe stage: Secondary | ICD-10-CM | POA: Diagnosis not present

## 2022-11-26 DIAGNOSIS — H40011 Open angle with borderline findings, low risk, right eye: Secondary | ICD-10-CM | POA: Diagnosis not present

## 2022-12-23 DIAGNOSIS — H401123 Primary open-angle glaucoma, left eye, severe stage: Secondary | ICD-10-CM | POA: Diagnosis not present

## 2022-12-23 DIAGNOSIS — H40011 Open angle with borderline findings, low risk, right eye: Secondary | ICD-10-CM | POA: Diagnosis not present

## 2023-02-11 ENCOUNTER — Encounter: Payer: Self-pay | Admitting: Emergency Medicine

## 2023-02-11 ENCOUNTER — Encounter (INDEPENDENT_AMBULATORY_CARE_PROVIDER_SITE_OTHER): Payer: BC Managed Care – PPO | Admitting: Ophthalmology

## 2023-02-11 DIAGNOSIS — H401132 Primary open-angle glaucoma, bilateral, moderate stage: Secondary | ICD-10-CM | POA: Diagnosis not present

## 2023-02-11 DIAGNOSIS — D3132 Benign neoplasm of left choroid: Secondary | ICD-10-CM | POA: Diagnosis not present

## 2023-02-11 DIAGNOSIS — H33312 Horseshoe tear of retina without detachment, left eye: Secondary | ICD-10-CM | POA: Diagnosis not present

## 2023-02-11 DIAGNOSIS — H43813 Vitreous degeneration, bilateral: Secondary | ICD-10-CM | POA: Diagnosis not present

## 2023-02-11 DIAGNOSIS — H18613 Keratoconus, stable, bilateral: Secondary | ICD-10-CM | POA: Diagnosis not present

## 2023-02-18 ENCOUNTER — Other Ambulatory Visit: Payer: Self-pay

## 2023-02-18 MED ORDER — METOPROLOL SUCCINATE ER 25 MG PO TB24: ORAL_TABLET | ORAL | 3 refills | Status: DC

## 2023-03-11 ENCOUNTER — Ambulatory Visit (INDEPENDENT_AMBULATORY_CARE_PROVIDER_SITE_OTHER): Payer: Medicare Other | Admitting: Emergency Medicine

## 2023-03-11 ENCOUNTER — Encounter: Payer: Self-pay | Admitting: Emergency Medicine

## 2023-03-11 VITALS — BP 110/72 | HR 105 | Temp 97.9°F | Ht 65.0 in | Wt 155.1 lb

## 2023-03-11 DIAGNOSIS — Z13228 Encounter for screening for other metabolic disorders: Secondary | ICD-10-CM

## 2023-03-11 DIAGNOSIS — Z0001 Encounter for general adult medical examination with abnormal findings: Secondary | ICD-10-CM

## 2023-03-11 DIAGNOSIS — Z23 Encounter for immunization: Secondary | ICD-10-CM

## 2023-03-11 DIAGNOSIS — R918 Other nonspecific abnormal finding of lung field: Secondary | ICD-10-CM | POA: Diagnosis not present

## 2023-03-11 DIAGNOSIS — Z0000 Encounter for general adult medical examination without abnormal findings: Secondary | ICD-10-CM | POA: Diagnosis not present

## 2023-03-11 DIAGNOSIS — Z130 Encounter for screening for diseases of the blood and blood-forming organs and certain disorders involving the immune mechanism: Secondary | ICD-10-CM

## 2023-03-11 DIAGNOSIS — Z1159 Encounter for screening for other viral diseases: Secondary | ICD-10-CM

## 2023-03-11 DIAGNOSIS — Z13820 Encounter for screening for osteoporosis: Secondary | ICD-10-CM

## 2023-03-11 DIAGNOSIS — I480 Paroxysmal atrial fibrillation: Secondary | ICD-10-CM

## 2023-03-11 DIAGNOSIS — Z1329 Encounter for screening for other suspected endocrine disorder: Secondary | ICD-10-CM | POA: Diagnosis not present

## 2023-03-11 DIAGNOSIS — Z1211 Encounter for screening for malignant neoplasm of colon: Secondary | ICD-10-CM

## 2023-03-11 DIAGNOSIS — Z13220 Encounter for screening for lipoid disorders: Secondary | ICD-10-CM

## 2023-03-11 DIAGNOSIS — Z1231 Encounter for screening mammogram for malignant neoplasm of breast: Secondary | ICD-10-CM

## 2023-03-11 LAB — CBC WITH DIFFERENTIAL/PLATELET
Basophils Absolute: 0 10*3/uL (ref 0.0–0.1)
Basophils Relative: 0.5 % (ref 0.0–3.0)
Eosinophils Absolute: 0.1 10*3/uL (ref 0.0–0.7)
Eosinophils Relative: 1.7 % (ref 0.0–5.0)
HCT: 43.7 % (ref 36.0–46.0)
Hemoglobin: 14.6 g/dL (ref 12.0–15.0)
Lymphocytes Relative: 22.9 % (ref 12.0–46.0)
Lymphs Abs: 1.1 10*3/uL (ref 0.7–4.0)
MCHC: 33.3 g/dL (ref 30.0–36.0)
MCV: 90.8 fl (ref 78.0–100.0)
Monocytes Absolute: 0.4 10*3/uL (ref 0.1–1.0)
Monocytes Relative: 8.7 % (ref 3.0–12.0)
Neutro Abs: 3.1 10*3/uL (ref 1.4–7.7)
Neutrophils Relative %: 66.2 % (ref 43.0–77.0)
Platelets: 248 10*3/uL (ref 150.0–400.0)
RBC: 4.81 Mil/uL (ref 3.87–5.11)
RDW: 13.2 % (ref 11.5–15.5)
WBC: 4.7 10*3/uL (ref 4.0–10.5)

## 2023-03-11 LAB — COMPREHENSIVE METABOLIC PANEL
ALT: 21 U/L (ref 0–35)
AST: 21 U/L (ref 0–37)
Albumin: 4.1 g/dL (ref 3.5–5.2)
Alkaline Phosphatase: 92 U/L (ref 39–117)
BUN: 17 mg/dL (ref 6–23)
CO2: 28 mEq/L (ref 19–32)
Calcium: 9.8 mg/dL (ref 8.4–10.5)
Chloride: 103 mEq/L (ref 96–112)
Creatinine, Ser: 0.72 mg/dL (ref 0.40–1.20)
GFR: 86.7 mL/min (ref >60.00)
Glucose, Bld: 91 mg/dL (ref 70–99)
Potassium: 3.9 mEq/L (ref 3.5–5.1)
Sodium: 137 mEq/L (ref 135–145)
Total Bilirubin: 0.6 mg/dL (ref 0.2–1.2)
Total Protein: 7.4 g/dL (ref 6.0–8.3)

## 2023-03-11 LAB — LIPID PANEL
Cholesterol: 181 mg/dL (ref 0–200)
HDL: 48 mg/dL (ref >39.00)
LDL Cholesterol: 102 mg/dL — ABNORMAL HIGH (ref 0–99)
NonHDL: 132.95
Total CHOL/HDL Ratio: 4
Triglycerides: 157 mg/dL — ABNORMAL HIGH (ref 0.0–149.0)
VLDL: 31.4 mg/dL (ref 0.0–40.0)

## 2023-03-11 LAB — TSH: TSH: 1.38 u[IU]/mL (ref 0.35–5.50)

## 2023-03-11 LAB — HEMOGLOBIN A1C: Hgb A1c MFr Bld: 5.5 % (ref 4.6–6.5)

## 2023-03-11 NOTE — Assessment & Plan Note (Signed)
Incidental finding on CT scan done February 2022 Bilateral pulmonary nodules Needs pulmonary evaluation Referral placed today.

## 2023-03-11 NOTE — Patient Instructions (Signed)

## 2023-03-11 NOTE — Assessment & Plan Note (Addendum)
Clinically stable.  Normal sinus rhythm today but slightly tachycardic. Continues metoprolol succinate 25 mg daily Continues to follow-up with cardiologist on a regular basis

## 2023-03-11 NOTE — Progress Notes (Signed)
Veronica Woods 67 y.o.   Chief Complaint  Patient presents with   New Patient (Initial Visit)    Prior patient,  no concerns     HISTORY OF PRESENT ILLNESS: This is a 67 y.o. female first visit to this office, here to reestablish care with me Last visit at a different office in 2018 Patient brings report of CT scan of abdomen and pelvis done in February 2022 which shows bilateral pulmonary nodules.  Otherwise okay.  Small hiatal hernia. Had some issues with tachycardia's.  Possible A-fib.  Sees cardiologist on a regular basis.  Not on anticoagulation treatment.  Taking metoprolol succinate daily. Today has no complaints or medical concerns today Last cardiologist office visit August 2023 assessment and plan as follows: Assessment & Plan    1.  PAF: She had not had any incidences of PAF since 2020 after COVID vaccine. She continues to have some elevation in her HR when she first awakens in the morning or when she gets up to use the bathroom at night.  She has not had to take any extra doses of metoprolol for sustained heart rate elevation.   Will continue watchful waiting. I have advised her to drink more water and to avoid excessive caffeine intake. If she takes extra doses of metoprolol for sustained HR elevation will need to increase her metoprolol to 25 mg XL at HS or have her take metoprolol 12.5 mg BID. She verbalizes understanding. Would like to check TSH, and Lipid panel but she is due for labs in a couple of months with PCP.      Current medicines are reviewed at length with the patient today.  I have spent 25 min's  dedicated to the care of this patient on the date of this encounter to include pre-visit review of records, assessment, management and diagnostic testing,with shared decision making.     Signed, Bettey Mare. Liborio Nixon, ANP, AACC   04/24/2022 8:29 AM     HPI   Prior to Admission medications   Medication Sig Start Date End Date Taking? Authorizing Provider   latanoprost (XALATAN) 0.005 % ophthalmic solution SMARTSIG:1 Drop(s) In Eye(s) Every Evening 02/12/23  Yes [provider]  metoprolol succinate (TOPROL-XL) 25 MG 24 hr tablet TAKE ONE-HALF TABLET BY MOUTH EVERY DAY GENERIC EQUIVALENT FOR TOPROL XL 02/18/23  Yes Croitoru, Mihai, MD    Allergies  Allergen Reactions   Elemental Sulfur Anaphylaxis and Other (See Comments)    Childhood reaction    Patient Active Problem List   Diagnosis Date Noted   Choroidal nevus, left eye 02/05/2020   Atrial fibrillation with rapid ventricular response (HCC)    Systolic click    Kidney stone 06/04/2017    Past Medical History:  Diagnosis Date   Allergy    Sulfa   Arthritis 2018   left foot   GERD (gastroesophageal reflux disease)    Glaucoma 2024   mainly left  use drops both eyes   Persistent atrial fibrillation (HCC) 2018   occured in the setting of urosepsis   Renal disorder    kidney stone   Sepsis (HCC)     Past Surgical History:  Procedure Laterality Date   DILATION AND CURETTAGE, DIAGNOSTIC / THERAPEUTIC  1990s   EYE SURGERY      Social History   Socioeconomic History   Marital status: Married    Spouse name: Not on file   Number of children: Not on file   Years of education: Not  on file   Highest education level: Not on file  Occupational History   Not on file  Tobacco Use   Smoking status: Never   Smokeless tobacco: Never  Vaping Use   Vaping status: Never Used  Substance and Sexual Activity   Alcohol use: No   Drug use: No   Sexual activity: Yes    Birth control/protection: Post-menopausal  Other Topics Concern   Not on file  Social History Narrative   Not on file   Social Determinants of Health   Financial Resource Strain: Not on file  Food Insecurity: Not on file  Transportation Needs: Not on file  Physical Activity: Not on file  Stress: Not on file  Social Connections: Not on file  Intimate Partner Violence: Not on file    Family History   Problem Relation Age of Onset   Cancer Mother    Heart disease Mother    Hyperlipidemia Mother    Hypertension Mother    Stroke Mother    Cancer Father      Review of Systems  Constitutional: Negative.  Negative for chills and fever.  HENT: Negative.  Negative for congestion and sore throat.   Respiratory: Negative.  Negative for cough and shortness of breath.   Cardiovascular:  Positive for palpitations. Negative for chest pain.  Gastrointestinal:  Negative for abdominal pain, nausea and vomiting.  Genitourinary: Negative.  Negative for dysuria and hematuria.  Skin: Negative.  Negative for rash.  Neurological: Negative.  Negative for dizziness and headaches.  All other systems reviewed and are negative.   Vitals:   03/11/23 0811  BP: 110/72  Pulse: (!) 105  Temp: 97.9 F (36.6 C)  SpO2: 98%    Physical Exam Vitals reviewed.  Constitutional:      Appearance: Normal appearance.  HENT:     Head: Normocephalic.     Mouth/Throat:     Mouth: Mucous membranes are moist.     Pharynx: Oropharynx is clear.  Eyes:     Extraocular Movements: Extraocular movements intact.     Conjunctiva/sclera: Conjunctivae normal.     Pupils: Pupils are equal, round, and reactive to light.  Cardiovascular:     Rate and Rhythm: Regular rhythm. Tachycardia present.     Pulses: Normal pulses.     Heart sounds: Normal heart sounds.  Pulmonary:     Effort: Pulmonary effort is normal.     Breath sounds: Normal breath sounds.  Abdominal:     Palpations: Abdomen is soft.     Tenderness: There is no abdominal tenderness.  Musculoskeletal:     Cervical back: No tenderness.     Right lower leg: No edema.     Left lower leg: No edema.  Lymphadenopathy:     Cervical: No cervical adenopathy.  Skin:    General: Skin is warm and dry.     Capillary Refill: Capillary refill takes less than 2 seconds.  Neurological:     General: No focal deficit present.     Mental Status: She is alert and  oriented to person, place, and time.  Psychiatric:        Mood and Affect: Mood normal.        Behavior: Behavior normal.      ASSESSMENT & PLAN: Problem List Items Addressed This Visit       Cardiovascular and Mediastinum   Paroxysmal atrial fibrillation (HCC)    Clinically stable.  Normal sinus rhythm today but slightly tachycardic. Continues metoprolol succinate 25 mg  daily Continues to follow-up with cardiologist on a regular basis        Respiratory   Pulmonary nodules    Incidental finding on CT scan done February 2022 Bilateral pulmonary nodules Needs pulmonary evaluation Referral placed today.      Relevant Orders   AMB  Referral to Pulmonary Nodule Clinic   Other Visit Diagnoses     Encounter for general adult medical examination with abnormal findings    -  Primary   Relevant Orders   CBC with Differential   Comprehensive metabolic panel   Hemoglobin A1c   Lipid panel   Hepatitis C antibody screen   Need for shingles vaccine       Need for hepatitis C screening test       Relevant Orders   Hepatitis C antibody screen   Visit for screening mammogram       Relevant Orders   MM Digital Screening   Colon cancer screening       Relevant Orders   Cologuard   Screening for deficiency anemia       Relevant Orders   CBC with Differential   Screening for lipoid disorders       Relevant Orders   Lipid panel   Screening for endocrine, metabolic and immunity disorder       Relevant Orders   Comprehensive metabolic panel   Hemoglobin A1c   TSH   Osteoporosis screening       Relevant Orders   DG Bone Density      Modifiable risk factors discussed with patient. Anticipatory guidance according to age provided. The following topics were also discussed: Social Determinants of Health Smoking.  Non-smoker Diet and nutrition Benefits of exercise Cancer screening and need for breast and colon cancer screening Vaccinations reviewed and  recommendations Cardiovascular risk assessment and need for blood work Mental health including depression and anxiety Fall and accident prevention  Patient Instructions  Health Maintenance, Female Adopting a healthy lifestyle and getting preventive care are important in promoting health and wellness. Ask your health care provider about: The right schedule for you to have regular tests and exams. Things you can do on your own to prevent diseases and keep yourself healthy. What should I know about diet, weight, and exercise? Eat a healthy diet  Eat a diet that includes plenty of vegetables, fruits, low-fat dairy products, and lean protein. Do not eat a lot of foods that are high in solid fats, added sugars, or sodium. Maintain a healthy weight Body mass index (BMI) is used to identify weight problems. It estimates body fat based on height and weight. Your health care provider can help determine your BMI and help you achieve or maintain a healthy weight. Get regular exercise Get regular exercise. This is one of the most important things you can do for your health. Most adults should: Exercise for at least 150 minutes each week. The exercise should increase your heart rate and make you sweat (moderate-intensity exercise). Do strengthening exercises at least twice a week. This is in addition to the moderate-intensity exercise. Spend less time sitting. Even light physical activity can be beneficial. Watch cholesterol and blood lipids Have your blood tested for lipids and cholesterol at 67 years of age, then have this test every 5 years. Have your cholesterol levels checked more often if: Your lipid or cholesterol levels are high. You are older than 68 years of age. You are at high risk for heart disease. What should I know  about cancer screening? Depending on your health history and family history, you may need to have cancer screening at various ages. This may include screening for: Breast  cancer. Cervical cancer. Colorectal cancer. Skin cancer. Lung cancer. What should I know about heart disease, diabetes, and high blood pressure? Blood pressure and heart disease High blood pressure causes heart disease and increases the risk of stroke. This is more likely to develop in people who have high blood pressure readings or are overweight. Have your blood pressure checked: Every 3-5 years if you are 22-17 years of age. Every year if you are 17 years old or older. Diabetes Have regular diabetes screenings. This checks your fasting blood sugar level. Have the screening done: Once every three years after age 39 if you are at a normal weight and have a low risk for diabetes. More often and at a younger age if you are overweight or have a high risk for diabetes. What should I know about preventing infection? Hepatitis B If you have a higher risk for hepatitis B, you should be screened for this virus. Talk with your health care provider to find out if you are at risk for hepatitis B infection. Hepatitis C Testing is recommended for: Everyone born from [birth year] through 1965. Anyone with known risk factors for hepatitis C. Sexually transmitted infections (STIs) Get screened for STIs, including gonorrhea and chlamydia, if: You are sexually active and are younger than 67 years of age. You are older than 67 years of age and your health care provider tells you that you are at risk for this type of infection. Your sexual activity has changed since you were last screened, and you are at increased risk for chlamydia or gonorrhea. Ask your health care provider if you are at risk. Ask your health care provider about whether you are at high risk for HIV. Your health care provider may recommend a prescription medicine to help prevent HIV infection. If you choose to take medicine to prevent HIV, you should first get tested for HIV. You should then be tested every 3 months for as long as you are taking  the medicine. Pregnancy If you are about to stop having your period (premenopausal) and you may become pregnant, seek counseling before you get pregnant. Take 400 to 800 micrograms (mcg) of folic acid every day if you become pregnant. Ask for birth control (contraception) if you want to prevent pregnancy. Osteoporosis and menopause Osteoporosis is a disease in which the bones lose minerals and strength with aging. This can result in bone fractures. If you are 34 years old or older, or if you are at risk for osteoporosis and fractures, ask your health care provider if you should: Be screened for bone loss. Take a calcium or vitamin D supplement to lower your risk of fractures. Be given hormone replacement therapy (HRT) to treat symptoms of menopause. Follow these instructions at home: Alcohol use Do not drink alcohol if: Your health care provider tells you not to drink. You are pregnant, may be pregnant, or are planning to become pregnant. If you drink alcohol: Limit how much you have to: 0-1 drink a day. Know how much alcohol is in your drink. In the [place] one drink equals one 12 oz bottle of beer (355 mL), one 5 oz glass of wine (148 mL), or one 1 oz glass of hard liquor (44 mL). Lifestyle Do not use any products that contain nicotine or tobacco. These products include cigarettes, chewing tobacco, and vaping  devices, such as e-cigarettes. If you need help quitting, ask your health care provider. Do not use street drugs. Do not share needles. Ask your health care provider for help if you need support or information about quitting drugs. General instructions Schedule regular health, dental, and eye exams. Stay current with your vaccines. Tell your health care provider if: You often feel depressed. You have ever been abused or do not feel safe at home. Summary Adopting a healthy lifestyle and getting preventive care are important in promoting health and wellness. Follow your health  care provider's instructions about healthy diet, exercising, and getting tested or screened for diseases. Follow your health care provider's instructions on monitoring your cholesterol and blood pressure. This information is not intended to replace advice given to you by your health care provider. Make sure you discuss any questions you have with your health care provider. Document Revised: 01/06/2021 Document Reviewed: 01/06/2021 Elsevier Patient Education  2024 Elsevier Inc.      Edwina Barth, MD [facility]

## 2023-03-12 LAB — HEPATITIS C ANTIBODY: Hepatitis C Ab: NONREACTIVE

## 2023-03-25 ENCOUNTER — Ambulatory Visit
Admission: RE | Admit: 2023-03-25 | Discharge: 2023-03-25 | Disposition: A | Discharge status: Home / Self Care | Payer: Medicare Other | Source: Ambulatory Visit | Attending: Emergency Medicine | Admitting: Emergency Medicine

## 2023-03-25 DIAGNOSIS — Z1231 Encounter for screening mammogram for malignant neoplasm of breast: Secondary | ICD-10-CM

## 2023-04-07 ENCOUNTER — Ambulatory Visit: Payer: Medicare Other | Admitting: Emergency Medicine

## 2023-04-07 ENCOUNTER — Other Ambulatory Visit: Payer: Self-pay | Admitting: *Deleted

## 2023-04-07 ENCOUNTER — Other Ambulatory Visit: Payer: Medicare Other

## 2023-04-07 ENCOUNTER — Ambulatory Visit (INDEPENDENT_AMBULATORY_CARE_PROVIDER_SITE_OTHER)
Admission: RE | Admit: 2023-04-07 | Discharge: 2023-04-07 | Disposition: A | Discharge status: Home / Self Care | Payer: Medicare Other | Source: Ambulatory Visit | Attending: Emergency Medicine | Admitting: Emergency Medicine

## 2023-04-07 DIAGNOSIS — R918 Other nonspecific abnormal finding of lung field: Secondary | ICD-10-CM

## 2023-04-07 DIAGNOSIS — Z13820 Encounter for screening for osteoporosis: Secondary | ICD-10-CM | POA: Diagnosis not present

## 2023-04-07 DIAGNOSIS — Z1211 Encounter for screening for malignant neoplasm of colon: Secondary | ICD-10-CM

## 2023-04-08 ENCOUNTER — Other Ambulatory Visit: Payer: Self-pay | Admitting: Emergency Medicine

## 2023-04-08 DIAGNOSIS — M810 Age-related osteoporosis without current pathological fracture: Secondary | ICD-10-CM

## 2023-04-08 MED ORDER — ALENDRONATE SODIUM 70 MG PO TABS
70.0000 mg | ORAL_TABLET | ORAL | 3 refills | Status: DC
Start: 2023-04-08 — End: 2023-08-12

## 2023-04-15 DIAGNOSIS — Z1211 Encounter for screening for malignant neoplasm of colon: Secondary | ICD-10-CM | POA: Diagnosis not present

## 2023-04-23 ENCOUNTER — Ambulatory Visit
Admission: RE | Admit: 2023-04-23 | Discharge: 2023-04-23 | Disposition: A | Discharge status: Home / Self Care | Payer: Medicare Other | Source: Ambulatory Visit | Attending: Emergency Medicine | Admitting: Emergency Medicine

## 2023-04-23 DIAGNOSIS — K449 Diaphragmatic hernia without obstruction or gangrene: Secondary | ICD-10-CM | POA: Diagnosis not present

## 2023-04-23 DIAGNOSIS — I700 Atherosclerosis of aorta: Secondary | ICD-10-CM | POA: Diagnosis not present

## 2023-04-23 DIAGNOSIS — R918 Other nonspecific abnormal finding of lung field: Secondary | ICD-10-CM | POA: Diagnosis not present

## 2023-05-04 ENCOUNTER — Telehealth: Payer: Self-pay

## 2023-05-04 NOTE — Telephone Encounter (Signed)
CRITICAL VALUE STICKER  CRITICAL VALUE: Chest CT abnormal findings   RECEIVER (on-site recipient of call): [employer]  DATE & TIME NOTIFIED: 05/04/2023 9:40am   MESSENGER (representative from lab): [facility]   MD NOTIFIED: Yes  TIME OF NOTIFICATION:05/04/2023 8:21am  RESPONSE:  Provider asked nurse to inform patient of findings.

## 2023-05-17 ENCOUNTER — Ambulatory Visit: Payer: Medicare Other

## 2023-05-17 ENCOUNTER — Telehealth: Payer: Self-pay | Admitting: Emergency Medicine

## 2023-05-17 DIAGNOSIS — R918 Other nonspecific abnormal finding of lung field: Secondary | ICD-10-CM

## 2023-05-17 NOTE — Telephone Encounter (Signed)
Thank you :)

## 2023-05-17 NOTE — Telephone Encounter (Signed)
Pt is calling to ask her Dr about him setting up a referral for this pt with the Pulmonary doctor after her ct scan appointment.

## 2023-05-17 NOTE — Telephone Encounter (Signed)
Called patient and left message for patient informing her that a new referral has been placed for her.

## 2023-05-18 ENCOUNTER — Ambulatory Visit: Payer: Medicare Other | Attending: Cardiovascular Disease | Admitting: Cardiovascular Disease

## 2023-05-18 ENCOUNTER — Encounter: Payer: Self-pay | Admitting: Cardiovascular Disease

## 2023-05-18 VITALS — BP 118/86 | HR 92 | Ht 65.0 in | Wt 152.8 lb

## 2023-05-18 DIAGNOSIS — I480 Paroxysmal atrial fibrillation: Secondary | ICD-10-CM | POA: Diagnosis not present

## 2023-05-18 DIAGNOSIS — I700 Atherosclerosis of aorta: Secondary | ICD-10-CM | POA: Diagnosis not present

## 2023-05-18 NOTE — Patient Instructions (Signed)
Medication Instructions:  No changes *If you need a refill on your cardiac medications before your next appointment, please call your pharmacy*  Testing/Procedures: Dr Royann Shivers has ordered a CT coronary calcium score.   Test locations:  [facility]  [place] [facility]  This is $99 out of pocket.   Coronary CalciumScan A coronary calcium scan is an imaging test used to look for deposits of calcium and other fatty materials (plaques) in the inner lining of the blood vessels of the heart (coronary arteries). These deposits of calcium and plaques can partly clog and narrow the coronary arteries without producing any symptoms or warning signs. This puts a person at risk for a heart attack. This test can detect these deposits before symptoms develop. Tell a health care provider about: Any allergies you have. All medicines you are taking, including vitamins, herbs, eye drops, creams, and over-the-counter medicines. Any problems you or family members have had with anesthetic medicines. Any blood disorders you have. Any surgeries you have had. Any medical conditions you have. Whether you are pregnant or may be pregnant. What are the risks? Generally, this is a safe procedure. However, problems may occur, including: Harm to a pregnant woman and her unborn baby. This test involves the use of radiation. Radiation exposure can be dangerous to a pregnant woman and her unborn baby. If you are pregnant, you generally should not have this procedure done. Slight increase in the risk of cancer. This is because of the radiation involved in the test. What happens before the procedure? No preparation is needed for this procedure. What happens during the procedure? You will undress and remove any jewelry around your neck or chest. You will put on a hospital gown. Sticky electrodes will be placed on your chest. The  electrodes will be connected to an electrocardiogram (ECG) machine to record a tracing of the electrical activity of your heart. A CT scanner will take pictures of your heart. During this time, you will be asked to lie still and hold your breath for 2-3 seconds while a picture of your heart is being taken. The procedure may vary among health care providers and hospitals. What happens after the procedure? You can get dressed. You can return to your normal activities. It is up to you to get the results of your test. Ask your health care provider, or the department that is doing the test, when your results will be ready. Summary A coronary calcium scan is an imaging test used to look for deposits of calcium and other fatty materials (plaques) in the inner lining of the blood vessels of the heart (coronary arteries). Generally, this is a safe procedure. Tell your health care provider if you are pregnant or may be pregnant. No preparation is needed for this procedure. A CT scanner will take pictures of your heart. You can return to your normal activities after the scan is done. This information is not intended to replace advice given to you by your health care provider. Make sure you discuss any questions you have with your health care provider. Document Released: 02/13/2008 Document Revised: 07/06/2016 Document Reviewed: 07/06/2016 Elsevier Interactive Patient Education  2017 [employer].   You can look into the [employer] device by [employer]. This device is purchased by you and it connects to an application you download to your smart phone.  It can detect abnormal heart rhythms and alert you to contact your doctor for  further evaluation. The web site is:  https://www.alivecor.com     Follow-Up: At [facility], you and your health needs are our priority.  As part of our continuing mission to provide you with exceptional heart care, we have created designated Provider Care Teams.   These Care Teams include your primary Cardiologist (physician) and Advanced Practice Providers (APPs -  Physician Assistants and Nurse Practitioners) who all work together to provide you with the care you need, when you need it.  We recommend signing up for the patient portal called "MyChart".  Sign up information is provided on this After Visit Summary.  MyChart is used to connect with patients for Virtual Visits (Telemedicine).  Patients are able to view lab/test results, encounter notes, upcoming appointments, etc.  Non-urgent messages can be sent to your provider as well.   To learn more about what you can do with MyChart, go to [email or web address].    Your next appointment:   1 year(s)  Provider:   Thurmon Fair, MD

## 2023-05-18 NOTE — Progress Notes (Signed)
Cardiology Office Note:    Date:  05/18/2023   ID:  Veronica Woods, DOB 08/02/1956, MRN [id number]  PCP:  Georgina Quint, MD  Cardiologist:  Thurmon Fair, MD   Referring MD: Georgina Quint, *   Chief Complaint  Patient presents with   Atrial Fibrillation          History of Present Illness:    Veronica Woods is a 67 y.o. female with a hx of persistent atrial fibrillation that occurred during an episode of urosepsis.  She did experience palpitations during that episode.  She required cardioversion, but has not had any recurrence of the arrhythmia since.  She does not have a history of stroke/TIA, diabetes, heart failure, coronary disease or hypertension.    Echocardiogram showed normal left ventricular systolic function, normal left atrial size and no serious valvular abnormalities.  Subsequent arrhythmia monitor did not show recurrence of atrial fibrillation.  Nuclear stress test in 2022 showed normal perfusion.  She recently had a chest CT as follow-up for pulmonary nodules that showed some aortic atherosclerosis, no evidence of aortic aneurysms and no mention of any coronary calcification.  She is aware of her heart rate accelerating when she gets up in the morning or when she lays down in bed at night, but the rhythm is always regular.  She is kept a detailed log of heart rate and blood pressure and all values are consistently in normal range.  The patient specifically denies any chest pain at rest exertion, dyspnea at rest or with exertion, orthopnea, paroxysmal nocturnal dyspnea, syncope, focal neurological deficits, intermittent claudication, lower extremity edema, unexplained weight gain, cough, hemoptysis or wheezing.   Her mother passed during the hospitalization for urosepsis and atrial fibrillation not long after that initial event.  Her sister also has had problems with atrial fibrillation.  Current Medications: Current Meds  Medication Sig   alendronate  (FOSAMAX) 70 MG tablet Take 1 tablet (70 mg total) by mouth every 7 (seven) days. Take with a full glass of water on an empty stomach.   latanoprost (XALATAN) 0.005 % ophthalmic solution SMARTSIG:1 Drop(s) In Eye(s) Every Evening   metoprolol succinate (TOPROL-XL) 25 MG 24 hr tablet TAKE ONE-HALF TABLET BY MOUTH EVERY DAY GENERIC EQUIVALENT FOR TOPROL XL     Allergies:   Elemental sulfur   Social History   Socioeconomic History   Marital status: Married    Spouse name: Not on file   Number of children: Not on file   Years of education: Not on file   Highest education level: Bachelor's degree (e.g., BA, AB, BS)  Occupational History   Not on file  Tobacco Use   Smoking status: Never   Smokeless tobacco: Never  Vaping Use   Vaping status: Never Used  Substance and Sexual Activity   Alcohol use: No   Drug use: No   Sexual activity: Yes    Birth control/protection: Post-menopausal  Other Topics Concern   Not on file  Social History Narrative   Not on file   Social Determinants of Health   Financial Resource Strain: Low Risk  (04/04/2023)   Overall Financial Resource Strain (CARDIA)    Difficulty of Paying Living Expenses: Not hard at all  Food Insecurity: No Food Insecurity (04/04/2023)   Hunger Vital Sign    Worried About Running Out of Food in the Last Year: Never true    Ran Out of Food in the Last Year: Never true  Transportation Needs: No Transportation  Needs (04/04/2023)   PRAPARE - Administrator, Civil Service (Medical): No    Lack of Transportation (Non-Medical): No  Physical Activity: Insufficiently Active (04/04/2023)   Exercise Vital Sign    Days of Exercise per Week: 3 days    Minutes of Exercise per Session: 30 min  Stress: No Stress Concern Present (04/04/2023)   [employer] of Occupational Health - Occupational Stress Questionnaire    Feeling of Stress : Not at all  Social Connections: Socially Integrated (04/04/2023)   Social Connection and  Isolation Panel [NHANES]    Frequency of Communication with Friends and Family: Once a week    Frequency of Social Gatherings with Friends and Family: More than three times a week    Attends Religious Services: More than 4 times per year    Active Member of [employer] or Organizations: Yes    Attends Banker Meetings: Never    Marital Status: Married     Family History: The patient's family history includes Cancer in her father and mother; Heart disease in her mother; Hyperlipidemia in her mother; Hypertension in her mother; Stroke in her mother.  ROS:   Please see the history of present illness.     All other systems reviewed and are negative.  EKGs/Labs/Other Studies Reviewed:    EKG: Ordered today, shows normal sinus rhythm, normal tracing Recent Labs: 03/11/2023: ALT 21; BUN 17; Creatinine, Ser 0.72; Hemoglobin 14.6; Platelets 248.0; Potassium 3.9; Sodium 137; TSH 1.38  Recent Lipid Panel    Component Value Date/Time   CHOL 181 03/11/2023 0850   TRIG 157.0 (H) 03/11/2023 0850   HDL 48.00 03/11/2023 0850   CHOLHDL 4 03/11/2023 0850   VLDL 31.4 03/11/2023 0850   LDLCALC 102 (H) 03/11/2023 0850    Physical Exam:    VS:  BP 118/86   Pulse 92   Ht 5\' 5"  (1.651 m)   Wt 152 lb 12.8 oz (69.3 kg)   SpO2 97%   BMI 25.43 kg/m     Wt Readings from Last 3 Encounters:  05/18/23 152 lb 12.8 oz (69.3 kg)  03/11/23 155 lb 2 oz (70.4 kg)  04/24/22 155 lb 6.4 oz (70.5 kg)      General: Alert, oriented x3, no distress, appears fit and slender Head: no evidence of trauma, PERRL, EOMI, no exophtalmos or lid lag, no myxedema, no xanthelasma; normal ears, nose and oropharynx Neck: normal jugular venous pulsations and no hepatojugular reflux; brisk carotid pulses without delay and no carotid bruits Chest: clear to auscultation, no signs of consolidation by percussion or palpation, normal fremitus, symmetrical and full respiratory excursions Cardiovascular: normal position and  quality of the apical impulse, regular rhythm, normal first and second heart sounds, no murmurs, rubs or gallops Abdomen: no tenderness or distention, no masses by palpation, no abnormal pulsatility or arterial bruits, normal bowel sounds, no hepatosplenomegaly Extremities: no clubbing, cyanosis or edema; 2+ radial, ulnar and brachial pulses bilaterally; 2+ right femoral, posterior tibial and dorsalis pedis pulses; 2+ left femoral, posterior tibial and dorsalis pedis pulses; no subclavian or femoral bruits Neurological: grossly nonfocal Psych: Normal mood and affect   ASSESSMENT:    1. Paroxysmal atrial fibrillation (HCC)   2. Aortic atherosclerosis (HCC)    PLAN:    In order of problems listed above:  AFIB:  Mrs. Trubey had a single episode of symptomatic atrial fibrillation during critical illness 6 years ago.  Other than some palpitations following a COVID-vaccine, she has been asymptomatic  from a rhythm standpoint.  She does not have any risk factors for recurrent arrhythmia or for embolic events, no evidence of structural heart disease.  Both her mother and sister have had atrial fibrillation.  There is no evidence of structural heart disease on echocardiography she does not have any cardiovascular complaints.   She does not meet criteria for anticoagulation.  We will monitor for arrhythmia recurrence on a yearly basis.  I also recommended purchasing some type of personal electronic cardiac rhythm monitoring device such as a smart watch or Kardia.  She can send me any suspicious tracings through MyChart.  We also discussed the fact that all bisphosphonates, whether administered orally or parenterally can increase the risk of atrial fibrillation slightly.  Since she has significant problems with reflux and there can be significant esophageal injury with bisphosphonate pills, it may be worthwhile considering a non-oral medication. Aortic atherosclerosis: Seen on CT ordered for evaluation of  pulmonary nodules.  Hard to quantify the amount of plaque based on the study.  Recommended a coronary calcium score.  Her LDL cholesterol is borderline at 102.  If her calcium score is 0 or less than average lipid-lowering therapy does not appear indicated, but if she has a high calcium score will recommend treatment with lipid-lowering medications to target LDL less than 70.   Medication Adjustments/Labs and Tests Ordered: Current medicines are reviewed at length with the patient today.  Concerns regarding medicines are outlined above.  Orders Placed This Encounter  Procedures   CT CARDIAC SCORING (SELF PAY ONLY)   EKG 12-Lead   EKG 12-Lead   No orders of the defined types were placed in this encounter.   Signed, Thurmon Fair, MD  05/18/2023 9:08 AM    [facility]

## 2023-06-02 ENCOUNTER — Encounter: Payer: Self-pay | Admitting: Emergency Medicine

## 2023-06-02 ENCOUNTER — Ambulatory Visit (INDEPENDENT_AMBULATORY_CARE_PROVIDER_SITE_OTHER): Payer: Medicare Other | Admitting: Emergency Medicine

## 2023-06-02 VITALS — BP 123/85 | HR 94 | Ht 65.0 in | Wt 153.2 lb

## 2023-06-02 DIAGNOSIS — K219 Gastro-esophageal reflux disease without esophagitis: Secondary | ICD-10-CM | POA: Diagnosis not present

## 2023-06-02 DIAGNOSIS — R918 Other nonspecific abnormal finding of lung field: Secondary | ICD-10-CM

## 2023-06-02 DIAGNOSIS — M1990 Unspecified osteoarthritis, unspecified site: Secondary | ICD-10-CM | POA: Insufficient documentation

## 2023-06-02 NOTE — Patient Instructions (Signed)
VISIT SUMMARY:  During your visit, we discussed the pulmonary nodules that were identified on your CT scan. These are small lumps in your lungs that have grown slightly larger since they were first noticed. We also talked about your occasional coughing at night, which might be due to acid reflux, and your recent diagnosis of osteoarthritis, which is a condition that causes joint pain and stiffness.  YOUR PLAN:  -PULMONARY NODULES: We're not sure what's causing these nodules, but we're considering inflammation or infection, and less likely, cancer. We've ordered some blood tests and a PET scan to get more information. We'll discuss the results at your next appointment and decide if we need to do a bronchoscopy, which is a procedure to look inside your lungs.  -GASTROESOPHAGEAL REFLUX DISEASE (GERD): This is a condition where stomach acid frequently flows back into your esophagus, causing irritation. You're not currently on any medication for this, but we'll keep an eye on your symptoms and consider treatment if they get worse or continue.  INSTRUCTIONS:  Please get the blood tests and PET scan done as soon as possible. We'll review the results at your next appointment. In the meantime, keep an eye on your symptoms of acid reflux and joint pain, and let us know if they get worse or continue.

## 2023-06-02 NOTE — Assessment & Plan Note (Signed)
Pulmonary Nodules Scattered pulmonary nodules identified on CT chest, some slightly larger than previously imaged. No respiratory symptoms reported. Etiology unclear, but considering inflammatory or infectious processes, and less likely neoplastic processes given stability over six years. -Order autoimmune labs including rheumatoid factor and ANCA. -Order PET scan to further characterize nodules noninvasively. -Plan for follow-up appointment to review results and discuss potential need for bronchoscopy.

## 2023-06-02 NOTE — Assessment & Plan Note (Signed)
Gastroesophageal Reflux Disease (GERD) Reports occasional nocturnal reflux symptoms and coughing, but not currently on reflux medication. -Monitor symptoms, consider treatment if symptoms worsen or persist.

## 2023-06-02 NOTE — Assessment & Plan Note (Signed)
Osteoarthritis Recently diagnosed, no current treatment mentioned. -Monitor symptoms, consider treatment if symptoms worsen or persist.

## 2023-06-02 NOTE — Progress Notes (Signed)
Subjective:    Patient ID: Veronica Woods, female    DOB: 01/21/1956, 67 y.o.   MRN: [id number]  HPI  The patient, a 67 year old never-smoker with a history of atrial fibrillation, GERD, allergic rhinitis, and osteoarthritis, presents for evaluation of pulmonary nodules identified on a CT scan of the chest. The nodules were initially noted on a CT scan of the abdomen performed in 2018 for a kidney stone and were again observed on a repeat abdominal CT scan in 2022. The most recent surveillance CT chest performed in 2024 showed scattered solid pulmonary nodules, some of which were slightly larger than previously imaged. The patient reports feeling well and denies any respiratory symptoms.  The patient's past medical history is significant for tachycardia and kidney issues following COVID-19 vaccination. The patient also reports a recent diagnosis of osteoporosis and a history of glaucoma and arthritis in the feet. The patient denies any history of cancer and reports no family history of lung cancer. The patient has lived locally all her life and worked as a Runner, broadcasting/film/video. The patient denies any significant environmental exposures or hobbies that could contribute to pulmonary nodules.  The patient reports occasional coughing, particularly in the morning and sometimes at night due to acid reflux. The patient denies any chest pain or other respiratory symptoms. On physical examination, the patient's lungs were clear, and there was no swelling in the legs. The patient's heart rate was normal at the time of the consultation.   RADIOLOGY CT chest: No evidence of pneumothorax, scattered solid pulmonary nodules including 1.1 x 0.7 cm posterior basal left lower lobe nodule, 0.8 cm peripheral left lower lobe nodule, peripheral right lower lobe nodules. Several nodules slightly larger than previously imaged, including basilar left lower lobe nodule now 1.1 cm up from 0.9 cm. (04/23/2023) CT abdomen: Basilar scatter  pulmonary nodules of different sizes. (06/04/2017)   Review of Systems As per HPI  Past Medical History:  Diagnosis Date   Allergy    Sulfa   Arthritis 2018   left foot   GERD (gastroesophageal reflux disease)    Glaucoma 2024   mainly left  use drops both eyes   Persistent atrial fibrillation (HCC) 2018   occured in the setting of urosepsis   Renal disorder    kidney stone   Sepsis (HCC)      Family History  Problem Relation Age of Onset   Cancer Mother    Heart disease Mother    Hyperlipidemia Mother    Hypertension Mother    Stroke Mother    Cancer Father     Family History - Father had prostate cancer - Mother had melanoma   Social History   Socioeconomic History   Marital status: Married    Spouse name: Not on file   Number of children: Not on file   Years of education: Not on file   Highest education level: Bachelor's degree (e.g., BA, AB, BS)  Occupational History   Not on file  Tobacco Use   Smoking status: Never   Smokeless tobacco: Never  Vaping Use   Vaping status: Never Used  Substance and Sexual Activity   Alcohol use: No   Drug use: No   Sexual activity: Yes    Birth control/protection: Post-menopausal  Other Topics Concern   Not on file  Social History Narrative   Not on file   Social Determinants of Health   Financial Resource Strain: Low Risk  (04/04/2023)   Overall Physicist, medical Strain (  CARDIA)    Difficulty of Paying Living Expenses: Not hard at all  Food Insecurity: No Food Insecurity (04/04/2023)   Hunger Vital Sign    Worried About Running Out of Food in the Last Year: Never true    Ran Out of Food in the Last Year: Never true  Transportation Needs: No Transportation Needs (04/04/2023)   PRAPARE - Administrator, Civil Service (Medical): No    Lack of Transportation (Non-Medical): No  Physical Activity: Insufficiently Active (04/04/2023)   Exercise Vital Sign    Days of Exercise per Week: 3 days    Minutes of  Exercise per Session: 30 min  Stress: No Stress Concern Present (04/04/2023)   [employer] of Occupational Health - Occupational Stress Questionnaire    Feeling of Stress : Not at all  Social Connections: Socially Integrated (04/04/2023)   Social Connection and Isolation Panel [NHANES]    Frequency of Communication with Friends and Family: Once a week    Frequency of Social Gatherings with Friends and Family: More than three times a week    Attends Religious Services: More than 4 times per year    Active Member of [employer] or Organizations: Yes    Attends Banker Meetings: Never    Marital Status: Married  Catering manager Violence: Not on file    She has lived locally all of her life  Has worked as a Runner, 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   Social History - Never smoker  Allergies  Allergen Reactions   Elemental Sulfur Anaphylaxis and Other (See Comments)    Childhood reaction     Outpatient Medications Prior to Visit  Medication Sig Dispense Refill   latanoprost (XALATAN) 0.005 % ophthalmic solution SMARTSIG:1 Drop(s) In Eye(s) Every Evening     metoprolol succinate (TOPROL-XL) 25 MG 24 hr tablet TAKE ONE-HALF TABLET BY MOUTH EVERY DAY GENERIC EQUIVALENT FOR TOPROL XL 90 tablet 3   alendronate (FOSAMAX) 70 MG tablet Take 1 tablet (70 mg total) by mouth every 7 (seven) days. Take with a full glass of water on an empty stomach. (Patient not taking: Reported on 06/02/2023) 12 tablet 3   No facility-administered medications prior to visit.         Objective:   Physical Exam Vitals:   06/02/23 0836  BP: 123/85  Pulse: 94  SpO2: 96%  Weight: 153 lb 3.2 oz (69.5 kg)  Height: 5\' 5"  (1.651 m)    Gen: Pleasant, well-nourished, in no distress,  normal affect  ENT: No lesions,  mouth clear,  oropharynx clear, no postnasal drip  Neck: No JVD, no stridor  Lungs: No use of accessory muscles, no crackles or wheezing on normal respiration, no wheeze on forced  expiration  Cardiovascular: RRR, heart sounds normal, no murmur or gallops, no peripheral edema  Musculoskeletal: No deformities, no cyanosis or clubbing  Neuro: alert, awake, non focal  Skin: Warm, no lesions or rash       Assessment & Plan:   Pulmonary nodules Pulmonary Nodules Scattered pulmonary nodules identified on CT chest, some slightly larger than previously imaged. No respiratory symptoms reported. Etiology unclear, but considering inflammatory or infectious processes, and less likely neoplastic processes given stability over six years. -Order autoimmune labs including rheumatoid factor and ANCA. -Order PET scan to further characterize nodules noninvasively. -Plan for follow-up appointment to review results and discuss potential need for bronchoscopy.   GERD (gastroesophageal reflux disease) Gastroesophageal Reflux Disease (GERD) Reports occasional nocturnal reflux symptoms and  coughing, but not currently on reflux medication. -Monitor symptoms, consider treatment if symptoms worsen or persist.  Osteoarthritis Osteoarthritis Recently diagnosed, no current treatment mentioned. -Monitor symptoms, consider treatment if symptoms worsen or persist.   Levy Pupa, MD, PhD 06/02/2023, 9:12 AM  Pulmonary and Critical Care [phone] or if no answer before 7:00PM call [phone] For any issues after 7:00PM please call eLink [phone]

## 2023-06-06 LAB — RHEUMATOID FACTOR: Rheumatoid fact SerPl-aCnc: <10 [IU]/mL (ref <14)

## 2023-06-06 LAB — ANCA SCREEN W REFLEX TITER: ANCA SCREEN: NEGATIVE

## 2023-06-06 LAB — ANA: Anti Nuclear Antibody (ANA): NEGATIVE

## 2023-06-11 ENCOUNTER — Ambulatory Visit (HOSPITAL_BASED_OUTPATIENT_CLINIC_OR_DEPARTMENT_OTHER)
Admission: RE | Admit: 2023-06-11 | Discharge: 2023-06-11 | Disposition: A | Discharge status: Home / Self Care | Payer: Medicare Other | Source: Ambulatory Visit | Attending: Cardiovascular Disease | Admitting: Cardiovascular Disease

## 2023-06-11 DIAGNOSIS — I700 Atherosclerosis of aorta: Secondary | ICD-10-CM | POA: Insufficient documentation

## 2023-06-14 ENCOUNTER — Ambulatory Visit (HOSPITAL_COMMUNITY)
Admission: RE | Admit: 2023-06-14 | Discharge: 2023-06-14 | Disposition: A | Discharge status: Home / Self Care | Payer: Medicare Other | Source: Ambulatory Visit | Attending: Emergency Medicine | Admitting: Emergency Medicine

## 2023-06-14 DIAGNOSIS — R918 Other nonspecific abnormal finding of lung field: Secondary | ICD-10-CM | POA: Diagnosis not present

## 2023-06-14 LAB — GLUCOSE, CAPILLARY: Glucose-Capillary: 86 mg/dL (ref 70–99)

## 2023-06-14 MED ORDER — FLUDEOXYGLUCOSE F - 18 (FDG) INJECTION
7.0000 | Freq: Once | INTRAVENOUS | Status: AC | PRN
  Administered 2023-06-14: 8.37 via INTRAVENOUS

## 2023-07-22 ENCOUNTER — Ambulatory Visit: Payer: Medicare Other | Admitting: Adult Health

## 2023-08-07 DIAGNOSIS — H524 Presbyopia: Secondary | ICD-10-CM | POA: Diagnosis not present

## 2023-08-07 DIAGNOSIS — H401123 Primary open-angle glaucoma, left eye, severe stage: Secondary | ICD-10-CM | POA: Diagnosis not present

## 2023-08-07 DIAGNOSIS — H18603 Keratoconus, unspecified, bilateral: Secondary | ICD-10-CM | POA: Diagnosis not present

## 2023-08-07 DIAGNOSIS — H25813 Combined forms of age-related cataract, bilateral: Secondary | ICD-10-CM | POA: Diagnosis not present

## 2023-08-07 DIAGNOSIS — H40011 Open angle with borderline findings, low risk, right eye: Secondary | ICD-10-CM | POA: Diagnosis not present

## 2023-08-12 ENCOUNTER — Encounter: Payer: Self-pay | Admitting: Emergency Medicine

## 2023-08-12 ENCOUNTER — Ambulatory Visit: Payer: Medicare Other | Admitting: Emergency Medicine

## 2023-08-12 VITALS — BP 110/80 | HR 92 | Ht 64.0 in | Wt 155.2 lb

## 2023-08-12 DIAGNOSIS — R918 Other nonspecific abnormal finding of lung field: Secondary | ICD-10-CM

## 2023-08-12 NOTE — Assessment & Plan Note (Signed)
Unclear etiology and with some very slight interval increase in size over a 6-year stretch.  Reassuring PET scan without any metabolic activity in the 10-11 mm nodule.  Her autoimmune labs are reassuring as well.  She does not have any respiratory symptoms or constitutional symptoms.  We will plan to repeat her CT chest in 1 year, sooner if any symptoms evolve.  I would like to establish 2 years of stability.  If there is an interval change we will plan bronchoscopy, other diagnostics

## 2023-08-12 NOTE — Patient Instructions (Signed)
We reviewed your PET scan and lab work today. We will plan to repeat your CT scan of the chest without contrast in October 2025 to follow small pulmonary nodules Please call our office if you develop any new constitutional symptoms (fever, chills, weight loss, sweats, etc.) Follow-up with Dr. Delton Coombes in October 2025 after your CT so we can review those results together

## 2023-08-12 NOTE — Progress Notes (Signed)
Subjective:    Patient ID: Veronica Woods, female    DOB: 1955/09/05, 67 y.o.   MRN: [id number]  HPI  ROV 08/12/2023 --follow-up visit for 67 year old woman with A-fib, GERD, allergic rhinitis, osteoarthritis.  She is a never smoker.  I saw her for pulmonary nodules noted on CT scan of the chest.  These were identified initially in 2018, followed with serial imaging.  The most recent scan from 04/23/2023 showed some had increased in size.  I ordered autoimmune labs and a PET scan and she returns to discuss.  PET scan from 06/14/2023 reviewed by me showed that the pulmonary nodules noted in August were stable in size and appearance without any evidence of hypermetabolism including the predominant 10 mm left lower lobe nodule.  61-month follow-up was recommended.  Labs: Rheumatoid factor, ANCA screen, ANA are all negative on 06/02/2023    Review of Systems As per HPI  Past Medical History:  Diagnosis Date   Allergy    Sulfa   Arthritis 2018   left foot   GERD (gastroesophageal reflux disease)    Glaucoma 2024   mainly left  use drops both eyes   Persistent atrial fibrillation (HCC) 2018   occured in the setting of urosepsis   Renal disorder    kidney stone   Sepsis (HCC)      Family History  Problem Relation Age of Onset   Cancer Mother    Heart disease Mother    Hyperlipidemia Mother    Hypertension Mother    Stroke Mother    Cancer Father     Family History - Father had prostate cancer - Mother had melanoma   Social History   Socioeconomic History   Marital status: Married    Spouse name: Not on file   Number of children: Not on file   Years of education: Not on file   Highest education level: Bachelor's degree (e.g., BA, AB, BS)  Occupational History   Not on file  Tobacco Use   Smoking status: Never   Smokeless tobacco: Never  Vaping Use   Vaping status: Never Used  Substance and Sexual Activity   Alcohol use: No   Drug use: No   Sexual activity: Yes     Birth control/protection: Post-menopausal  Other Topics Concern   Not on file  Social History Narrative   Not on file   Social Drivers of Health   Financial Resource Strain: Low Risk  (04/04/2023)   Overall Financial Resource Strain (CARDIA)    Difficulty of Paying Living Expenses: Not hard at all  Food Insecurity: No Food Insecurity (04/04/2023)   Hunger Vital Sign    Worried About Running Out of Food in the Last Year: Never true    Ran Out of Food in the Last Year: Never true  Transportation Needs: No Transportation Needs (04/04/2023)   PRAPARE - Administrator, Civil Service (Medical): No    Lack of Transportation (Non-Medical): No  Physical Activity: Insufficiently Active (04/04/2023)   Exercise Vital Sign    Days of Exercise per Week: 3 days    Minutes of Exercise per Session: 30 min  Stress: No Stress Concern Present (04/04/2023)   [employer] of Occupational Health - Occupational Stress Questionnaire    Feeling of Stress : Not at all  Social Connections: Socially Integrated (04/04/2023)   Social Connection and Isolation Panel [NHANES]    Frequency of Communication with Friends and Family: Once a week  Frequency of Social Gatherings with Friends and Family: More than three times a week    Attends Religious Services: More than 4 times per year    Active Member of [employer] or Organizations: Yes    Attends Banker Meetings: Never    Marital Status: Married  Catering manager Violence: Not on file    She has lived locally all of her life  Has worked as a Runner, 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   Social History - Never smoker  Allergies  Allergen Reactions   Elemental Sulfur Anaphylaxis and Other (See Comments)    Childhood reaction     Outpatient Medications Prior to Visit  Medication Sig Dispense Refill   dorzolamide (TRUSOPT) 2 % ophthalmic solution Place 1 drop into the left eye 2 (two) times daily.     latanoprost (XALATAN) 0.005 % ophthalmic  solution SMARTSIG:1 Drop(s) In Eye(s) Every Evening     metoprolol succinate (TOPROL-XL) 25 MG 24 hr tablet TAKE ONE-HALF TABLET BY MOUTH EVERY DAY GENERIC EQUIVALENT FOR TOPROL XL 90 tablet 3   alendronate (FOSAMAX) 70 MG tablet Take 1 tablet (70 mg total) by mouth every 7 (seven) days. Take with a full glass of water on an empty stomach. (Patient not taking: Reported on 06/02/2023) 12 tablet 3   No facility-administered medications prior to visit.         Objective:   Physical Exam Vitals:   08/12/23 0929  BP: 110/80  Pulse: 92  SpO2: 98%  Weight: 155 lb 3.2 oz (70.4 kg)  Height: 5\' 4"  (1.626 m)    Gen: Pleasant, well-nourished, in no distress,  normal affect  ENT: No lesions,  mouth clear,  oropharynx clear, no postnasal drip  Neck: No JVD, no stridor  Lungs: No use of accessory muscles, no crackles or wheezing on normal respiration, no wheeze on forced expiration  Cardiovascular: RRR, heart sounds normal, no murmur or gallops, no peripheral edema  Musculoskeletal: No deformities, no cyanosis or clubbing  Neuro: alert, awake, non focal  Skin: Warm, no lesions or rash       Assessment & Plan:   Pulmonary nodules Unclear etiology and with some very slight interval increase in size over a 6-year stretch.  Reassuring PET scan without any metabolic activity in the 10-11 mm nodule.  Her autoimmune labs are reassuring as well.  She does not have any respiratory symptoms or constitutional symptoms.  We will plan to repeat her CT chest in 1 year, sooner if any symptoms evolve.  I would like to establish 2 years of stability.  If there is an interval change we will plan bronchoscopy, other diagnostics    Levy Pupa, MD, PhD 08/12/2023, 9:59 AM [facility] [phone] or if no answer before 7:00PM call [phone] For any issues after 7:00PM please call eLink [phone]

## 2023-09-08 DIAGNOSIS — H401123 Primary open-angle glaucoma, left eye, severe stage: Secondary | ICD-10-CM | POA: Diagnosis not present

## 2023-09-08 DIAGNOSIS — H40011 Open angle with borderline findings, low risk, right eye: Secondary | ICD-10-CM | POA: Diagnosis not present

## 2023-11-10 DIAGNOSIS — H401123 Primary open-angle glaucoma, left eye, severe stage: Secondary | ICD-10-CM | POA: Diagnosis not present

## 2023-11-10 DIAGNOSIS — H40011 Open angle with borderline findings, low risk, right eye: Secondary | ICD-10-CM | POA: Diagnosis not present

## 2023-11-17 ENCOUNTER — Ambulatory Visit (INDEPENDENT_AMBULATORY_CARE_PROVIDER_SITE_OTHER): Payer: Medicare Other

## 2023-11-17 VITALS — BP 120/78 | Ht 64.0 in | Wt 157.6 lb

## 2023-11-17 DIAGNOSIS — Z0000 Encounter for general adult medical examination without abnormal findings: Secondary | ICD-10-CM

## 2023-11-17 NOTE — Patient Instructions (Signed)
 Veronica Woods , Thank you for taking time to come for your Medicare Wellness Visit. I appreciate your ongoing commitment to your health goals. Please review the following plan we discussed and let me know if I can assist you in the future.   Referrals/Orders/Follow-Ups/Clinician Recommendations: Aim for 30 minutes of exercise or brisk walking, 6-8 glasses of water, and 5 servings of fruits and vegetables each day. Educated and advised on Shingles, Pneumonia, and COVID vaccines.  This is a list of the screening recommended for you and due dates:  Health Maintenance  Topic Date Due   Colon Cancer Screening  Never done   Zoster (Shingles) Vaccine (1 of 2) Never done   COVID-19 Vaccine (1 - 2024-25 season) Never done   Flu Shot  11/29/2023*   Pneumonia Vaccine (1 of 2 - PCV) 03/10/2024*   Medicare Annual Wellness Visit  11/16/2024   Mammogram  03/24/2025   DTaP/Tdap/Td vaccine (2 - Tdap) 03/06/2026   DEXA scan (bone density measurement)  Completed   Hepatitis C Screening  Completed   HPV Vaccine  Aged Out  *Topic was postponed. The date shown is not the original due date.    Advanced directives: (Copy Requested) Please bring a copy of your health care power of attorney and living will to the office to be added to your chart at your convenience. You can mail to [facility] [address] [place], [place] [address] or email to [email or web address]  Next Medicare Annual Wellness Visit scheduled for next year: Yes

## 2023-11-17 NOTE — Progress Notes (Signed)
 Subjective:   Veronica Woods is a 68 y.o. who presents for a Medicare Wellness preventive visit.  Visit Complete: In person  Persons Participating in Visit: Patient.  AWV Questionnaire: No: Patient Medicare AWV questionnaire was not completed prior to this visit.  Cardiac Risk Factors include: advanced age (>5men, >76 women)     Objective:    Today's Vitals   11/17/23 0845  BP: 120/78  Weight: 157 lb 9.6 oz (71.5 kg)  Height: 5\' 4"  (1.626 m)   Body mass index is 27.05 kg/m.     11/17/2023    8:42 AM 09/26/2020    4:00 PM 06/25/2017    1:54 PM 06/25/2017   11:19 AM  Advanced Directives  Does Patient Have a Medical Advance Directive? Yes Yes Yes Yes  Type of Estate agent of [place];Living will Healthcare Power of [place];Living will Healthcare Power of [employer] of [place];Living will  Does patient want to make changes to medical advance directive?   No - Patient declined   Copy of Healthcare Power of Attorney in Chart? No - copy requested  No - copy requested     Current Medications (verified) Outpatient Encounter Medications as of 11/17/2023  Medication Sig   metoprolol succinate (TOPROL-XL) 25 MG 24 hr tablet TAKE ONE-HALF TABLET BY MOUTH EVERY DAY GENERIC EQUIVALENT FOR TOPROL XL   Netarsudil-Latanoprost (ROCKLATAN) 0.02-0.005 % SOLN Place 0.02 drops into both eyes 1 day or 1 dose.   [DISCONTINUED] dorzolamide (TRUSOPT) 2 % ophthalmic solution Place 1 drop into the left eye 2 (two) times daily.   [DISCONTINUED] latanoprost (XALATAN) 0.005 % ophthalmic solution SMARTSIG:1 Drop(s) In Eye(s) Every Evening   No facility-administered encounter medications on file as of 11/17/2023.    Allergies (verified) Elemental sulfur   History: Past Medical History:  Diagnosis Date   Allergy    Sulfa   Arthritis 2018   left foot   GERD (gastroesophageal reflux disease)    Glaucoma 2024   mainly left  use drops both eyes   Persistent  atrial fibrillation (HCC) 2018   occured in the setting of urosepsis   Renal disorder    kidney stone   Sepsis (HCC)    Past Surgical History:  Procedure Laterality Date   DILATION AND CURETTAGE, DIAGNOSTIC / THERAPEUTIC  1990s   EYE SURGERY     Family History  Problem Relation Age of Onset   Cancer Mother    Heart disease Mother    Hyperlipidemia Mother    Hypertension Mother    Stroke Mother    Cancer Father    Social History   Socioeconomic History   Marital status: Married    Spouse name: Not on file   Number of children: Not on file   Years of education: Not on file   Highest education level: Bachelor's degree (e.g., BA, AB, BS)  Occupational History   Not on file  Tobacco Use   Smoking status: Never    Passive exposure: Never   Smokeless tobacco: Never  Vaping Use   Vaping status: Never Used  Substance and Sexual Activity   Alcohol use: No   Drug use: No   Sexual activity: Yes    Birth control/protection: Post-menopausal  Other Topics Concern   Not on file  Social History Narrative   Not on file   Social Drivers of Health   Financial Resource Strain: Low Risk  (11/17/2023)   Overall Financial Resource Strain (CARDIA)    Difficulty of  Paying Living Expenses: Not hard at all  Food Insecurity: No Food Insecurity (11/17/2023)   Hunger Vital Sign    Worried About Running Out of Food in the Last Year: Never true    Ran Out of Food in the Last Year: Never true  Transportation Needs: No Transportation Needs (11/17/2023)   PRAPARE - Administrator, Civil Service (Medical): No    Lack of Transportation (Non-Medical): No  Physical Activity: Insufficiently Active (11/17/2023)   Exercise Vital Sign    Days of Exercise per Week: 4 days    Minutes of Exercise per Session: 30 min  Stress: No Stress Concern Present (11/17/2023)   [employer] of Occupational Health - Occupational Stress Questionnaire    Feeling of Stress : Not at all  Social  Connections: Moderately Integrated (11/17/2023)   Social Connection and Isolation Panel [NHANES]    Frequency of Communication with Friends and Family: More than three times a week    Frequency of Social Gatherings with Friends and Family: Once a week    Attends Religious Services: More than 4 times per year    Active Member of [employer] or Organizations: No    Attends Engineer, structural: Never    Marital Status: Married    Tobacco Counseling Counseling given: No    Clinical Intake:  Pre-visit preparation completed: Yes  Pain : No/denies pain     BMI - recorded: 27.05 Nutritional Status: BMI 25 -29 Overweight Nutritional Risks: None Diabetes: No  Lab Results  Component Value Date   HGBA1C 5.5 03/11/2023     How often do you need to have someone help you when you read instructions, pamphlets, or other written materials from your doctor or pharmacy?: 1 - Never  Interpreter Needed?: No  Information entered by :: Hassell Halim, CMA   Activities of Daily Living     11/17/2023    8:47 AM  In your present state of health, do you have any difficulty performing the following activities:  Hearing? 0  Vision? 0  Difficulty concentrating or making decisions? 0  Walking or climbing stairs? 0  Dressing or bathing? 0  Doing errands, shopping? 0  Preparing Food and eating ? N  Using the Toilet? N  In the past six months, have you accidently leaked urine? N  Do you have problems with loss of bowel control? N  Managing your Medications? N  Managing your Finances? N  Housekeeping or managing your Housekeeping? N    Patient Care Team: Georgina Quint, MD as PCP - General (Internal Medicine) Croitoru, Rachelle Hora, MD as PCP - Cardiology (Cardiology) [facility], [facility] (Ophthalmology)  Indicate any recent Medical Services you may have received from other than [facility] providers in the past year (date may be approximate).     Assessment:   This is a routine  wellness examination for [name].  Hearing/Vision screen Hearing Screening - Comments:: Denies hearing difficulties   Vision Screening - Comments:: Wears rx glasses - up to date with routine eye exams with Dr Benjamine Mola   Goals Addressed               This Visit's Progress     Patient Stated (pt-stated)        Patient stated she plans to lose weight (5-10lbs).       Depression Screen     11/17/2023    8:50 AM 03/11/2023    8:12 AM 06/04/2017    8:25 AM 05/25/2017  9:54 AM 05/11/2017   11:15 AM 03/06/2016    3:41 PM  PHQ 2/9 Scores  PHQ - 2 Score 0 0 0 0 0 0  PHQ- 9 Score 0         Fall Risk     11/17/2023    8:48 AM 03/11/2023    8:11 AM 06/04/2017    8:25 AM 05/25/2017    9:54 AM 05/11/2017   11:15 AM  Fall Risk   Falls in the past year? 0 0 No No No  Number falls in past yr: 0 0     Injury with Fall? 0 0     Risk for fall due to : No Fall Risks No Fall Risks     Follow up Falls prevention discussed;Falls evaluation completed Falls evaluation completed       MEDICARE RISK AT HOME:  Medicare Risk at Home Any stairs in or around the home?: Yes (outside) If so, are there any without handrails?: No Home free of loose throw rugs in walkways, pet beds, electrical cords, etc?: Yes Adequate lighting in your home to reduce risk of falls?: Yes Life alert?: No Use of a cane, walker or w/c?: No Grab bars in the bathroom?: No Shower chair or bench in shower?: No Elevated toilet seat or a handicapped toilet?: Yes  TIMED UP AND GO:  Was the test performed?  No  Cognitive Function: 6CIT completed        11/17/2023    8:49 AM  6CIT Screen  What Year? 0 points  What month? 0 points  What time? 0 points  Count back from 20 0 points  Months in reverse 0 points  Repeat phrase 0 points  Total Score 0 points    Immunizations Immunization History  Administered Date(s) Administered   Td 03/06/2016    Screening Tests Health Maintenance  Topic Date Due   Colonoscopy   Never done   Zoster Vaccines- Shingrix (1 of 2) Never done   COVID-19 Vaccine (1 - 2024-25 season) Never done   INFLUENZA VACCINE  11/29/2023 (Originally 04/01/2023)   Pneumonia Vaccine 28+ Years old (1 of 2 - PCV) 03/10/2024 (Originally 01/27/1962)   Medicare Annual Wellness (AWV)  11/16/2024   MAMMOGRAM  03/24/2025   DTaP/Tdap/Td (2 - Tdap) 03/06/2026   DEXA SCAN  Completed   Hepatitis C Screening  Completed   HPV VACCINES  Aged Out    Health Maintenance  Health Maintenance Due  Topic Date Due   Colonoscopy  Never done   Zoster Vaccines- Shingrix (1 of 2) Never done   COVID-19 Vaccine (1 - 2024-25 season) Never done   Health Maintenance Items Addressed: 11/17/2023  Pt declines to have the COVID, Shingles, Influenza, and Pneumonia vaccines.  Additional Screening:  Vision Screening: Recommended annual ophthalmology exams for early detection of glaucoma and other disorders of the eye. Pt is currently seeing Dr Benjamine Mola for routine eye exams.    Dental Screening: Recommended annual dental exams for proper oral hygiene  Community Resource Referral / Chronic Care Management: CRR required this visit?  No   CCM required this visit?  No     Plan:     I have personally reviewed and noted the following in the patient's chart:   Medical and social history Use of alcohol, tobacco or illicit drugs  Current medications and supplements including opioid prescriptions. Patient is not currently taking opioid prescriptions. Functional ability and status Nutritional status Physical activity Advanced directives List of other physicians  Hospitalizations, surgeries, and ER visits in previous 12 months Vitals Screenings to include cognitive, depression, and falls Referrals and appointments  In addition, I have reviewed and discussed with patient certain preventive protocols, quality metrics, and best practice recommendations. A written personalized care plan for preventive services as well as  general preventive health recommendations were provided to patient.     Darreld Mclean, CMA   11/17/2023   After Visit Summary: (MyChart) Due to this being a telephonic visit, the after visit summary with patients personalized plan was offered to patient via MyChart   Notes: Nothing significant to report at this time.

## 2023-11-30 ENCOUNTER — Ambulatory Visit (INDEPENDENT_AMBULATORY_CARE_PROVIDER_SITE_OTHER): Admitting: Emergency Medicine

## 2023-11-30 ENCOUNTER — Encounter: Payer: Self-pay | Admitting: Emergency Medicine

## 2023-11-30 VITALS — BP 122/88 | HR 97 | Temp 98.4°F | Ht 64.0 in | Wt 158.0 lb

## 2023-11-30 DIAGNOSIS — M810 Age-related osteoporosis without current pathological fracture: Secondary | ICD-10-CM | POA: Insufficient documentation

## 2023-11-30 DIAGNOSIS — R918 Other nonspecific abnormal finding of lung field: Secondary | ICD-10-CM

## 2023-11-30 DIAGNOSIS — K219 Gastro-esophageal reflux disease without esophagitis: Secondary | ICD-10-CM

## 2023-11-30 DIAGNOSIS — I480 Paroxysmal atrial fibrillation: Secondary | ICD-10-CM

## 2023-11-30 LAB — COMPREHENSIVE METABOLIC PANEL WITH GFR
ALT: 18 U/L (ref 0–35)
AST: 18 U/L (ref 0–37)
Albumin: 4.1 g/dL (ref 3.5–5.2)
Alkaline Phosphatase: 103 U/L (ref 39–117)
BUN: 14 mg/dL (ref 6–23)
CO2: 28 meq/L (ref 19–32)
Calcium: 9.6 mg/dL (ref 8.4–10.5)
Chloride: 104 meq/L (ref 96–112)
Creatinine, Ser: 0.68 mg/dL (ref 0.40–1.20)
GFR: 89.86 mL/min (ref >60.00)
Glucose, Bld: 82 mg/dL (ref 70–99)
Potassium: 4.6 meq/L (ref 3.5–5.1)
Sodium: 139 meq/L (ref 135–145)
Total Bilirubin: 0.6 mg/dL (ref 0.2–1.2)
Total Protein: 7.5 g/dL (ref 6.0–8.3)

## 2023-11-30 LAB — CBC WITH DIFFERENTIAL/PLATELET
Basophils Absolute: 0 10*3/uL (ref 0.0–0.1)
Basophils Relative: 0.5 % (ref 0.0–3.0)
Eosinophils Absolute: 0.1 10*3/uL (ref 0.0–0.7)
Eosinophils Relative: 2.5 % (ref 0.0–5.0)
HCT: 44 % (ref 36.0–46.0)
Hemoglobin: 14.8 g/dL (ref 12.0–15.0)
Lymphocytes Relative: 26.4 % (ref 12.0–46.0)
Lymphs Abs: 1.1 10*3/uL (ref 0.7–4.0)
MCHC: 33.7 g/dL (ref 30.0–36.0)
MCV: 91.6 fl (ref 78.0–100.0)
Monocytes Absolute: 0.4 10*3/uL (ref 0.1–1.0)
Monocytes Relative: 10.6 % (ref 3.0–12.0)
Neutro Abs: 2.5 10*3/uL (ref 1.4–7.7)
Neutrophils Relative %: 60 % (ref 43.0–77.0)
Platelets: 263 10*3/uL (ref 150.0–400.0)
RBC: 4.8 Mil/uL (ref 3.87–5.11)
RDW: 13.5 % (ref 11.5–15.5)
WBC: 4.1 10*3/uL (ref 4.0–10.5)

## 2023-11-30 LAB — LIPID PANEL
Cholesterol: 168 mg/dL (ref 0–200)
HDL: 49.6 mg/dL (ref >39.00)
LDL Cholesterol: 95 mg/dL (ref 0–99)
NonHDL: 118.31
Total CHOL/HDL Ratio: 3
Triglycerides: 119 mg/dL (ref 0.0–149.0)
VLDL: 23.8 mg/dL (ref 0.0–40.0)

## 2023-11-30 LAB — HEMOGLOBIN A1C: Hgb A1c MFr Bld: 5.3 % (ref 4.6–6.5)

## 2023-11-30 NOTE — Progress Notes (Signed)
 Veronica Woods 68 y.o.   Chief Complaint  Patient presents with   Follow-up    8 month f/u patient states she thought this was going to be her physical. She also had questions about the fosamax     HISTORY OF PRESENT ILLNESS: This is a 68 y.o. female here for follow-up of chronic medical conditions Overall doing well.  Has no complaints or medical concerns today. Mammogram done last summer.  Normal Had Cologuard done which was negative  HPI   Prior to Admission medications   Medication Sig Start Date End Date Taking? Authorizing Provider  metoprolol succinate (TOPROL-XL) 25 MG 24 hr tablet TAKE ONE-HALF TABLET BY MOUTH EVERY DAY GENERIC EQUIVALENT FOR TOPROL XL 02/18/23  Yes Croitoru, Mihai, MD  Netarsudil-Latanoprost (ROCKLATAN) 0.02-0.005 % SOLN Place 0.02 drops into both eyes 1 day or 1 dose. 11/10/23  Yes [provider]    Allergies  Allergen Reactions   Elemental Sulfur Anaphylaxis and Other (See Comments)    Childhood reaction    Patient Active Problem List   Diagnosis Date Noted   GERD (gastroesophageal reflux disease) 06/02/2023   Osteoarthritis 06/02/2023   Paroxysmal atrial fibrillation (HCC) 03/11/2023   Pulmonary nodules 03/11/2023   Choroidal nevus, left eye 02/05/2020   Systolic click    Kidney stone 06/04/2017    Past Medical History:  Diagnosis Date   Allergy    Sulfa   Arthritis 2018   left foot   GERD (gastroesophageal reflux disease)    Glaucoma 2024   mainly left  use drops both eyes   Persistent atrial fibrillation (HCC) 2018   occured in the setting of urosepsis   Renal disorder    kidney stone   Sepsis (HCC)     Past Surgical History:  Procedure Laterality Date   DILATION AND CURETTAGE, DIAGNOSTIC / THERAPEUTIC  1990s   EYE SURGERY      Social History   Socioeconomic History   Marital status: Married    Spouse name: Not on file   Number of children: Not on file   Years of education: Not on file   Highest education  level: Bachelor's degree (e.g., BA, AB, BS)  Occupational History   Not on file  Tobacco Use   Smoking status: Never    Passive exposure: Never   Smokeless tobacco: Never  Vaping Use   Vaping status: Never Used  Substance and Sexual Activity   Alcohol use: No   Drug use: No   Sexual activity: Yes    Birth control/protection: Post-menopausal  Other Topics Concern   Not on file  Social History Narrative   Not on file   Social Drivers of Health   Financial Resource Strain: Low Risk  (11/29/2023)   Overall Financial Resource Strain (CARDIA)    Difficulty of Paying Living Expenses: Not hard at all  Food Insecurity: No Food Insecurity (11/29/2023)   Hunger Vital Sign    Worried About Running Out of Food in the Last Year: Never true    Ran Out of Food in the Last Year: Never true  Transportation Needs: No Transportation Needs (11/29/2023)   PRAPARE - Administrator, Civil Service (Medical): No    Lack of Transportation (Non-Medical): No  Physical Activity: Sufficiently Active (11/29/2023)   Exercise Vital Sign    Days of Exercise per Week: 3 days    Minutes of Exercise per Session: 50 min  Recent Concern: Physical Activity - Insufficiently Active (11/17/2023)   Exercise  Vital Sign    Days of Exercise per Week: 4 days    Minutes of Exercise per Session: 30 min  Stress: No Stress Concern Present (11/29/2023)   [employer] of Occupational Health - Occupational Stress Questionnaire    Feeling of Stress : Not at all  Social Connections: Socially Integrated (11/29/2023)   Social Connection and Isolation Panel [NHANES]    Frequency of Communication with Friends and Family: Twice a week    Frequency of Social Gatherings with Friends and Family: More than three times a week    Attends Religious Services: More than 4 times per year    Active Member of [employer] or Organizations: Yes    Attends Engineer, structural: More than 4 times per year    Marital Status: Married   Catering manager Violence: Not At Risk (11/17/2023)   Humiliation, Afraid, Rape, and Kick questionnaire    Fear of Current or Ex-Partner: No    Emotionally Abused: No    Physically Abused: No    Sexually Abused: No    Family History  Problem Relation Age of Onset   Cancer Mother    Heart disease Mother    Hyperlipidemia Mother    Hypertension Mother    Stroke Mother    Cancer Father      Review of Systems  Constitutional: Negative.  Negative for chills and fever.  HENT: Negative.  Negative for congestion and sore throat.   Respiratory: Negative.  Negative for cough and shortness of breath.   Cardiovascular: Negative.  Negative for chest pain and palpitations.  Gastrointestinal:  Negative for abdominal pain, diarrhea, nausea and vomiting.  Genitourinary: Negative.  Negative for dysuria and hematuria.  Skin: Negative.  Negative for rash.  Neurological: Negative.  Negative for dizziness and headaches.  All other systems reviewed and are negative.   Vitals:   11/30/23 0848  BP: 122/88  Pulse: 97  Temp: 98.4 F (36.9 C)  SpO2: 98%    Physical Exam Vitals reviewed.  Constitutional:      Appearance: Normal appearance.  HENT:     Head: Normocephalic.     Right Ear: Tympanic membrane, ear canal and external ear normal.     Left Ear: Tympanic membrane, ear canal and external ear normal.     Mouth/Throat:     Mouth: Mucous membranes are moist.     Pharynx: Oropharynx is clear.  Eyes:     Extraocular Movements: Extraocular movements intact.     Conjunctiva/sclera: Conjunctivae normal.     Pupils: Pupils are equal, round, and reactive to light.  Cardiovascular:     Rate and Rhythm: Normal rate and regular rhythm.     Pulses: Normal pulses.     Heart sounds: Normal heart sounds.  Pulmonary:     Effort: Pulmonary effort is normal.     Breath sounds: Normal breath sounds.  Abdominal:     Palpations: Abdomen is soft.     Tenderness: There is no abdominal tenderness.   Musculoskeletal:     Cervical back: No tenderness.  Lymphadenopathy:     Cervical: No cervical adenopathy.  Skin:    General: Skin is warm and dry.     Capillary Refill: Capillary refill takes less than 2 seconds.  Neurological:     General: No focal deficit present.     Mental Status: She is alert and oriented to person, place, and time.  Psychiatric:        Mood and Affect: Mood normal.  Behavior: Behavior normal.      ASSESSMENT & PLAN: A total of 43 minutes was spent with the patient and counseling/coordination of care regarding preparing for this visit, review of most recent office visit notes, review of multiple chronic medical conditions and their management, review of all medications, review of most recent bloodwork results, review of health maintenance items, education on nutrition, prognosis, documentation, and need for follow up.   Problem List Items Addressed This Visit       Cardiovascular and Mediastinum   Paroxysmal atrial fibrillation (HCC) - Primary   Clinically stable.  Normal sinus rhythm today. Continues metoprolol succinate 25 mg daily Continues to follow-up with cardiologist on a regular basis      Relevant Orders   CBC with Differential/Platelet   Comprehensive metabolic panel with GFR   Hemoglobin A1c   Lipid panel     Respiratory   Pulmonary nodules   Unclear etiology and with some very slight interval increase in size over a 6-year stretch. Reassuring PET scan without any metabolic activity in the 10-11 mm nodule. Her autoimmune labs are reassuring as well. She does not have any respiratory symptoms or constitutional symptoms. We will plan to repeat her CT chest in 1 year, sooner if any symptoms evolve. I would like to establish 2 years of stability. If there is an interval change we will plan bronchoscopy, other diagnostics       Relevant Orders   CBC with Differential/Platelet   Comprehensive metabolic panel with GFR   Hemoglobin A1c    Lipid panel     Digestive   GERD (gastroesophageal reflux disease)   Gastroesophageal Reflux Disease (GERD) Reports occasional nocturnal reflux symptoms and coughing, but not currently on reflux medication. -Monitor symptoms, consider treatment if symptoms worsen or persist.      Relevant Orders   CBC with Differential/Platelet   Comprehensive metabolic panel with GFR   Hemoglobin A1c   Lipid panel     Musculoskeletal and Integument   Age-related osteoporosis without current pathological fracture   Not candidate for Fosamax due to GERD and chronic esophageal problems. May benefit from Prolia injections Will refer to osteoporosis clinic for further evaluation and management      Relevant Orders   Amb Referral to Osteoporosis Management    CBC with Differential/Platelet   Comprehensive metabolic panel with GFR   Hemoglobin A1c   Lipid panel   Patient Instructions  Health Maintenance After Age 3 After age 72, you are at a higher risk for certain long-term diseases and infections as well as injuries from falls. Falls are a major cause of broken bones and head injuries in people who are older than age 40. Getting regular preventive care can help to keep you healthy and well. Preventive care includes getting regular testing and making lifestyle changes as recommended by your health care provider. Talk with your health care provider about: Which screenings and tests you should have. A screening is a test that checks for a disease when you have no symptoms. A diet and exercise plan that is right for you. What should I know about screenings and tests to prevent falls? Screening and testing are the best ways to find a health problem early. Early diagnosis and treatment give you the best chance of managing medical conditions that are common after age 42. Certain conditions and lifestyle choices may make you more likely to have a fall. Your health care provider may recommend: Regular vision  checks. Poor vision and  conditions such as cataracts can make you more likely to have a fall. If you wear glasses, make sure to get your prescription updated if your vision changes. Medicine review. Work with your health care provider to regularly review all of the medicines you are taking, including over-the-counter medicines. Ask your health care provider about any side effects that may make you more likely to have a fall. Tell your health care provider if any medicines that you take make you feel dizzy or sleepy. Strength and balance checks. Your health care provider may recommend certain tests to check your strength and balance while standing, walking, or changing positions. Foot health exam. Foot pain and numbness, as well as not wearing proper footwear, can make you more likely to have a fall. Screenings, including: Osteoporosis screening. Osteoporosis is a condition that causes the bones to get weaker and break more easily. Blood pressure screening. Blood pressure changes and medicines to control blood pressure can make you feel dizzy. Depression screening. You may be more likely to have a fall if you have a fear of falling, feel depressed, or feel unable to do activities that you used to do. Alcohol use screening. Using too much alcohol can affect your balance and may make you more likely to have a fall. Follow these instructions at home: Lifestyle Do not drink alcohol if: Your health care provider tells you not to drink. If you drink alcohol: Limit how much you have to: 0-1 drink a day for women. 0-2 drinks a day for men. Know how much alcohol is in your drink. In the [place] one drink equals one 12 oz bottle of beer (355 mL), one 5 oz glass of wine (148 mL), or one 1 oz glass of hard liquor (44 mL). Do not use any products that contain nicotine or tobacco. These products include cigarettes, chewing tobacco, and vaping devices, such as e-cigarettes. If you need help quitting, ask your  health care provider. Activity  Follow a regular exercise program to stay fit. This will help you maintain your balance. Ask your health care provider what types of exercise are appropriate for you. If you need a cane or walker, use it as recommended by your health care provider. Wear supportive shoes that have nonskid soles. Safety  Remove any tripping hazards, such as rugs, cords, and clutter. Install safety equipment such as grab bars in bathrooms and safety rails on stairs. Keep rooms and walkways well-lit. General instructions Talk with your health care provider about your risks for falling. Tell your health care provider if: You fall. Be sure to tell your health care provider about all falls, even ones that seem minor. You feel dizzy, tiredness (fatigue), or off-balance. Take over-the-counter and prescription medicines only as told by your health care provider. These include supplements. Eat a healthy diet and maintain a healthy weight. A healthy diet includes low-fat dairy products, low-fat (lean) meats, and fiber from whole grains, beans, and lots of fruits and vegetables. Stay current with your vaccines. Schedule regular health, dental, and eye exams. Summary Having a healthy lifestyle and getting preventive care can help to protect your health and wellness after age 49. Screening and testing are the best way to find a health problem early and help you avoid having a fall. Early diagnosis and treatment give you the best chance for managing medical conditions that are more common for people who are older than age 31. Falls are a major cause of broken bones and head injuries in people  who are older than age 46. Take precautions to prevent a fall at home. Work with your health care provider to learn what changes you can make to improve your health and wellness and to prevent falls. This information is not intended to replace advice given to you by your health care provider. Make sure  you discuss any questions you have with your health care provider. Document Revised: 01/06/2021 Document Reviewed: 01/06/2021 Elsevier Patient Education  2024 Elsevier Inc.     Edwina Barth, MD [facility]

## 2023-11-30 NOTE — Patient Instructions (Signed)
 Health Maintenance After Age 68 After age 4, you are at a higher risk for certain long-term diseases and infections as well as injuries from falls. Falls are a major cause of broken bones and head injuries in people who are older than age 47. Getting regular preventive care can help to keep you healthy and well. Preventive care includes getting regular testing and making lifestyle changes as recommended by your health care provider. Talk with your health care provider about: Which screenings and tests you should have. A screening is a test that checks for a disease when you have no symptoms. A diet and exercise plan that is right for you. What should I know about screenings and tests to prevent falls? Screening and testing are the best ways to find a health problem early. Early diagnosis and treatment give you the best chance of managing medical conditions that are common after age 37. Certain conditions and lifestyle choices may make you more likely to have a fall. Your health care provider may recommend: Regular vision checks. Poor vision and conditions such as cataracts can make you more likely to have a fall. If you wear glasses, make sure to get your prescription updated if your vision changes. Medicine review. Work with your health care provider to regularly review all of the medicines you are taking, including over-the-counter medicines. Ask your health care provider about any side effects that may make you more likely to have a fall. Tell your health care provider if any medicines that you take make you feel dizzy or sleepy. Strength and balance checks. Your health care provider may recommend certain tests to check your strength and balance while standing, walking, or changing positions. Foot health exam. Foot pain and numbness, as well as not wearing proper footwear, can make you more likely to have a fall. Screenings, including: Osteoporosis screening. Osteoporosis is a condition that causes  the bones to get weaker and break more easily. Blood pressure screening. Blood pressure changes and medicines to control blood pressure can make you feel dizzy. Depression screening. You may be more likely to have a fall if you have a fear of falling, feel depressed, or feel unable to do activities that you used to do. Alcohol use screening. Using too much alcohol can affect your balance and may make you more likely to have a fall. Follow these instructions at home: Lifestyle Do not drink alcohol if: Your health care provider tells you not to drink. If you drink alcohol: Limit how much you have to: 0-1 drink a day for women. 0-2 drinks a day for men. Know how much alcohol is in your drink. In the [place] one drink equals one 12 oz bottle of beer (355 mL), one 5 oz glass of wine (148 mL), or one 1 oz glass of hard liquor (44 mL). Do not use any products that contain nicotine or tobacco. These products include cigarettes, chewing tobacco, and vaping devices, such as e-cigarettes. If you need help quitting, ask your health care provider. Activity  Follow a regular exercise program to stay fit. This will help you maintain your balance. Ask your health care provider what types of exercise are appropriate for you. If you need a cane or walker, use it as recommended by your health care provider. Wear supportive shoes that have nonskid soles. Safety  Remove any tripping hazards, such as rugs, cords, and clutter. Install safety equipment such as grab bars in bathrooms and safety rails on stairs. Keep rooms and walkways  well-lit. General instructions Talk with your health care provider about your risks for falling. Tell your health care provider if: You fall. Be sure to tell your health care provider about all falls, even ones that seem minor. You feel dizzy, tiredness (fatigue), or off-balance. Take over-the-counter and prescription medicines only as told by your health care provider. These include  supplements. Eat a healthy diet and maintain a healthy weight. A healthy diet includes low-fat dairy products, low-fat (lean) meats, and fiber from whole grains, beans, and lots of fruits and vegetables. Stay current with your vaccines. Schedule regular health, dental, and eye exams. Summary Having a healthy lifestyle and getting preventive care can help to protect your health and wellness after age 11. Screening and testing are the best way to find a health problem early and help you avoid having a fall. Early diagnosis and treatment give you the best chance for managing medical conditions that are more common for people who are older than age 28. Falls are a major cause of broken bones and head injuries in people who are older than age 48. Take precautions to prevent a fall at home. Work with your health care provider to learn what changes you can make to improve your health and wellness and to prevent falls. This information is not intended to replace advice given to you by your health care provider. Make sure you discuss any questions you have with your health care provider. Document Revised: 01/06/2021 Document Reviewed: 01/06/2021 Elsevier Patient Education  2024 [employer].

## 2023-11-30 NOTE — Assessment & Plan Note (Signed)
 Not candidate for Fosamax due to GERD and chronic esophageal problems. May benefit from Prolia injections Will refer to osteoporosis clinic for further evaluation and management

## 2023-11-30 NOTE — Assessment & Plan Note (Signed)
Gastroesophageal Reflux Disease (GERD) Reports occasional nocturnal reflux symptoms and coughing, but not currently on reflux medication. -Monitor symptoms, consider treatment if symptoms worsen or persist.

## 2023-11-30 NOTE — Assessment & Plan Note (Signed)
 Clinically stable.  Normal sinus rhythm today. Continues metoprolol succinate 25 mg daily Continues to follow-up with cardiologist on a regular basis

## 2023-11-30 NOTE — Assessment & Plan Note (Signed)
 Unclear etiology and with some very slight interval increase in size over a 6-year stretch.  Reassuring PET scan without any metabolic activity in the 10-11 mm nodule.  Her autoimmune labs are reassuring as well.  She does not have any respiratory symptoms or constitutional symptoms.  We will plan to repeat her CT chest in 1 year, sooner if any symptoms evolve.  I would like to establish 2 years of stability.  If there is an interval change we will plan bronchoscopy, other diagnostics

## 2023-12-10 DIAGNOSIS — H401123 Primary open-angle glaucoma, left eye, severe stage: Secondary | ICD-10-CM | POA: Diagnosis not present

## 2023-12-10 DIAGNOSIS — H40011 Open angle with borderline findings, low risk, right eye: Secondary | ICD-10-CM | POA: Diagnosis not present

## 2023-12-20 DIAGNOSIS — K080 Exfoliation of teeth due to systemic causes: Secondary | ICD-10-CM | POA: Diagnosis not present

## 2024-01-04 DIAGNOSIS — K080 Exfoliation of teeth due to systemic causes: Secondary | ICD-10-CM | POA: Diagnosis not present

## 2024-01-31 ENCOUNTER — Ambulatory Visit (INDEPENDENT_AMBULATORY_CARE_PROVIDER_SITE_OTHER): Admitting: Physician Assistant

## 2024-01-31 ENCOUNTER — Encounter: Payer: Self-pay | Admitting: Physician Assistant

## 2024-01-31 ENCOUNTER — Other Ambulatory Visit: Payer: Self-pay | Admitting: Physician Assistant

## 2024-01-31 DIAGNOSIS — M810 Age-related osteoporosis without current pathological fracture: Secondary | ICD-10-CM

## 2024-01-31 NOTE — Addendum Note (Signed)
 Addended by: Georgann Kim on: 01/31/2024 09:41 AM   Modules accepted: Orders

## 2024-01-31 NOTE — Progress Notes (Signed)
 Office Visit Note   Patient: Veronica Woods           Date of Birth: 1955/09/26           MRN: [id number] Visit Date: 01/31/2024              Requested by: Veronica Hammersmith, MD [address] [place],  [place] [address] PCP: Veronica Hammersmith, MD   Assessment & Plan: Visit Diagnoses: Osteoporosis  Plan: [name] is a pleasant active 68 year old woman who comes in today for evaluation of osteoporosis.  She had a bone density scan last August that came back with a T-score of -2.7 at the lumbar spine.  She was prescribed Fosamax  by her primary care doctor.  Unfortunately she has significant reflux as well as a hiatal hernia was concerned about taking Fosamax .  She has never had a fracture.  She has had tachycardia since COVID but is never had a heart attack heart disease or stroke.  No history is of cancer or kidney disease or an ulcer.  She has no history of epilepsy or seizures she went through menopause at [age] has not had any hormone replacement.  She does not take calcium though her calcium stores are good today she does not take vitamin D does not have a vitamin D done recently so we will obtain that today.  She is not a smoker or drinker.  She enjoys walking hiking and gardening 2-3 times a week.  She is just finishing up a crown.  She said her mom did have a hip fracture.  She has a FRAX score of 16 for major osteoporotic fracture but 4.1 for a hip fracture in the next 10 years.  Based on her activity I have encouraged her to incorporate weight training.  Have given her information about calcium and vitamin D.  I think she would be given her young age a good candidate for Evenity followed by Prolia.  Fosamax  would not be a good choice just because of her hiatal hernia and her reflux.  Given her information.  I spent 45 minutes with her.  Answered all her questions she will consider all of this and will contact us  if she would like to go forward  Follow-Up Instructions: Return if  symptoms worsen or fail to improve.   Orders:  No orders of the defined types were placed in this encounter.  No orders of the defined types were placed in this encounter.     Procedures: No procedures performed   Clinical Data: No additional findings.   Subjective: No chief complaint on file.   HPI patient is a pleasant 68 year old woman referred by primary care for evaluation of osteoporosis.  She is never taken any osteoporotic medications before.  She did have a low T-score and was supposed to take Fosamax  but she was concerned about side effects as she has significant reflux and hiatal hernia  Review of Systems  All other systems reviewed and are negative.    Objective: Vital Signs: There were no vitals taken for this visit.  Physical Exam Constitutional:      Appearance: Normal appearance.  Pulmonary:     Effort: Pulmonary effort is normal.  Skin:    General: Skin is warm and dry.  Neurological:     General: No focal deficit present.     Mental Status: She is alert and oriented to person, place, and time.  Psychiatric:        Mood and  Affect: Mood normal.        Behavior: Behavior normal.       Specialty Comments:  No specialty comments available.  Imaging: No results found.   PMFS History: Patient Active Problem List   Diagnosis Date Noted   Age-related osteoporosis without current pathological fracture 11/30/2023   GERD (gastroesophageal reflux disease) 06/02/2023   Osteoarthritis 06/02/2023   Paroxysmal atrial fibrillation (HCC) 03/11/2023   Pulmonary nodules 03/11/2023   Choroidal nevus, left eye 02/05/2020   Systolic click    Kidney stone 06/04/2017   Past Medical History:  Diagnosis Date   Allergy    Sulfa   Arthritis 2018   left foot   GERD (gastroesophageal reflux disease)    Glaucoma 2024   mainly left  use drops both eyes   Persistent atrial fibrillation (HCC) 2018   occured in the setting of urosepsis   Renal disorder     kidney stone   Sepsis (HCC)     Family History  Problem Relation Age of Onset   Cancer Mother    Heart disease Mother    Hyperlipidemia Mother    Hypertension Mother    Stroke Mother    Cancer Father     Past Surgical History:  Procedure Laterality Date   DILATION AND CURETTAGE, DIAGNOSTIC / THERAPEUTIC  1990s   EYE SURGERY     Social History   Occupational History   Not on file  Tobacco Use   Smoking status: Never    Passive exposure: Never   Smokeless tobacco: Never  Vaping Use   Vaping status: Never Used  Substance and Sexual Activity   Alcohol use: No   Drug use: No   Sexual activity: Yes    Birth control/protection: Post-menopausal

## 2024-02-03 LAB — HOUSE ACCOUNT TRACKING

## 2024-02-03 LAB — EXTRA SPECIMEN

## 2024-02-14 ENCOUNTER — Telehealth: Payer: Self-pay | Admitting: Physician Assistant

## 2024-02-14 DIAGNOSIS — H33312 Horseshoe tear of retina without detachment, left eye: Secondary | ICD-10-CM | POA: Diagnosis not present

## 2024-02-14 DIAGNOSIS — H18613 Keratoconus, stable, bilateral: Secondary | ICD-10-CM | POA: Diagnosis not present

## 2024-02-14 DIAGNOSIS — H43813 Vitreous degeneration, bilateral: Secondary | ICD-10-CM | POA: Diagnosis not present

## 2024-02-14 DIAGNOSIS — H401132 Primary open-angle glaucoma, bilateral, moderate stage: Secondary | ICD-10-CM | POA: Diagnosis not present

## 2024-02-14 NOTE — Telephone Encounter (Signed)
 Pt called requesting a call back concerning test results from blood work last week. Pt also has some medical questions. Pleas call pt at [phone].

## 2024-02-22 DIAGNOSIS — K080 Exfoliation of teeth due to systemic causes: Secondary | ICD-10-CM | POA: Diagnosis not present

## 2024-03-10 DIAGNOSIS — H401123 Primary open-angle glaucoma, left eye, severe stage: Secondary | ICD-10-CM | POA: Diagnosis not present

## 2024-03-10 DIAGNOSIS — H40011 Open angle with borderline findings, low risk, right eye: Secondary | ICD-10-CM | POA: Diagnosis not present

## 2024-03-13 DIAGNOSIS — K080 Exfoliation of teeth due to systemic causes: Secondary | ICD-10-CM | POA: Diagnosis not present

## 2024-04-06 ENCOUNTER — Telehealth: Payer: Self-pay

## 2024-04-06 NOTE — Telephone Encounter (Signed)
 On 8-5 submitted for prolia

## 2024-04-07 ENCOUNTER — Other Ambulatory Visit: Payer: Self-pay | Admitting: Cardiovascular Disease

## 2024-04-10 ENCOUNTER — Telehealth: Payer: Self-pay

## 2024-04-10 NOTE — Telephone Encounter (Signed)
 Called patient and LVM that she can have Prolia at no cost to her and to please call me back to let me know if she wants to move forward and I get her scheduled and complete the authorization

## 2024-04-11 ENCOUNTER — Telehealth: Payer: Self-pay | Admitting: Physician Assistant

## 2024-04-11 NOTE — Telephone Encounter (Signed)
 Pt returned call to Tori about Prolia Injections. Pt states she has a few more questions. Please call pt tomorrow at [phone].

## 2024-04-12 ENCOUNTER — Telehealth: Payer: Self-pay

## 2024-04-12 NOTE — Telephone Encounter (Signed)
 Called patient and she would like to speak to Ronal Dragon she has some questions for her before she decides to schedule

## 2024-05-10 ENCOUNTER — Encounter: Payer: Self-pay | Admitting: Cardiovascular Disease

## 2024-05-17 ENCOUNTER — Telehealth (HOSPITAL_BASED_OUTPATIENT_CLINIC_OR_DEPARTMENT_OTHER): Payer: Self-pay

## 2024-05-17 NOTE — Telephone Encounter (Signed)
 SABRA

## 2024-05-18 ENCOUNTER — Telehealth: Payer: Self-pay | Admitting: Physician Assistant

## 2024-05-18 NOTE — Telephone Encounter (Signed)
 Patient called and said that you was to set her up for a vitamin D test. CB#[phone]

## 2024-05-22 ENCOUNTER — Encounter: Payer: Self-pay | Admitting: Emergency Medicine

## 2024-05-24 ENCOUNTER — Other Ambulatory Visit: Payer: Self-pay | Admitting: Emergency Medicine

## 2024-05-24 ENCOUNTER — Ambulatory Visit

## 2024-05-24 DIAGNOSIS — M810 Age-related osteoporosis without current pathological fracture: Secondary | ICD-10-CM

## 2024-05-24 NOTE — Progress Notes (Signed)
 Patient is here for blood draw only.

## 2024-05-25 LAB — VITAMIN D 25 HYDROXY (VIT D DEFICIENCY, FRACTURES): Vit D, 25-Hydroxy: 45 ng/mL (ref 30–100)

## 2024-06-06 ENCOUNTER — Ambulatory Visit
Admission: RE | Admit: 2024-06-06 | Discharge: 2024-06-06 | Disposition: A | Discharge status: Home / Self Care | Source: Ambulatory Visit | Attending: Emergency Medicine | Admitting: Emergency Medicine

## 2024-06-06 DIAGNOSIS — R918 Other nonspecific abnormal finding of lung field: Secondary | ICD-10-CM

## 2024-06-07 ENCOUNTER — Ambulatory Visit: Attending: Cardiology | Admitting: Emergency Medicine

## 2024-06-07 ENCOUNTER — Encounter: Payer: Self-pay | Admitting: Emergency Medicine

## 2024-06-07 VITALS — BP 110/70 | HR 98 | Resp 16 | Ht 64.0 in | Wt 154.0 lb

## 2024-06-07 DIAGNOSIS — I700 Atherosclerosis of aorta: Secondary | ICD-10-CM | POA: Diagnosis not present

## 2024-06-07 DIAGNOSIS — I480 Paroxysmal atrial fibrillation: Secondary | ICD-10-CM

## 2024-06-07 DIAGNOSIS — I4719 Other supraventricular tachycardia: Secondary | ICD-10-CM | POA: Diagnosis not present

## 2024-06-07 MED ORDER — METOPROLOL SUCCINATE ER 25 MG PO TB24: 12.5000 mg | ORAL_TABLET | Freq: Every day | ORAL | 3 refills | Status: AC

## 2024-06-07 NOTE — Patient Instructions (Addendum)
 Medication Instructions:  NO CHANGES  Lab Work: NONE TO BE DONE.  Testing/Procedures: NONE  Follow-Up: At [facility], you and your health needs are our priority.  As part of our continuing mission to provide you with exceptional heart care, our providers are all part of one team.  This team includes your primary Cardiologist (physician) and Advanced Practice Providers or APPs (Physician Assistants and Nurse Practitioners) who all work together to provide you with the care you need, when you need it.  Your next appointment:   1 YEAR  Provider:   Jerel Balding, MD  OR Lum Louis, NP

## 2024-06-07 NOTE — Progress Notes (Signed)
 Cardiology Office Note:    Date:  06/07/2024  ID:  Veronica Woods, DOB 18-Nov-1955, MRN [id number] PCP: Purcell Emil Schanz, MD  [facility] Providers Cardiologist:  Jerel Balding, MD       Patient Profile:       Chief Complaint: 1 year follow-up History of Present Illness:  Veronica Woods is a 68 y.o. female with visit-pertinent history of persistent atrial fibrillation  She has history of persistent atrial fibrillation that occurred during an episode of urosepsis.  She did experience palpitations during the episode.  She required cardioversion and has not had any reoccurrence of her arrhythmia since.  Echocardiogram 01/01/2020 showed LVEF 60 to 65%, no RWMA, grade 1 DD, RV function and size normal, normal PASP, trivial MR.  ZIO 11/2019 showed mildly abnormal rhythm monitor with rare episodes of atrial tachycardia and occasional premature atrial ventricular contractions.  Exercise Myoview  10/24/2020 was normal exercise nuclear test with no evidence of prior infarct or ischemia.  She was last seen in clinic by Dr. JAYSON on 05/18/2023.  She was doing well at the time without further recurrence of atrial fibrillation or symptoms to suggest recurrence.  She recently had CT scan ordered for evaluation of pulmonary nodules.  This showed aortic atherosclerosis.  Patient underwent CT cardiac scoring on 06/2023 showing coronary calcium score of 0.   Discussed the use of AI scribe software for clinical note transcription with the patient, who gave verbal consent to proceed.  History of Present Illness Veronica Woods is a 68 year old female with atrial fibrillation who presents for routine follow-up.  Today she is doing well overall without acute cardiovascular concerns.  She we will occasionally experience episodes of fast heartbeats upon waking at night or in the morning, which resolve after a few minutes. She denies fluttering sensations or sensations of her heart stopping and starting again. There is  no chest pain or shortness of breath.  No orthopnea, PND, syncope, LEE, presyncope, lightheadedness or dizziness  She is on metoprolol  daily for atrial fibrillation and is doing well on this medication. Her last cholesterol check six months ago showed an LDL level of 95, maintained through diet alone.  A CT scan over a year ago showed aortic atherosclerosis. She remains active by working in the yard and caring for her two-year-old grandson, although she does not engage in routine exercise.   Review of systems:  Please see the history of present illness. All other systems are reviewed and otherwise negative.      Studies Reviewed:    EKG Interpretation Date/Time:  Wednesday June 07 2024 07:48:37 EDT Ventricular Rate:  91 PR Interval:  152 QRS Duration:  108 QT Interval:  344 QTC Calculation: 423 R Axis:   50  Text Interpretation: Normal sinus rhythm Low voltage QRS Right bundle branch block When compared with ECG of 18-May-2023 08:32, No significant change was found Confirmed by Veronica Woods [phone]) on 06/07/2024 8:23:46 AM    CT cardiac scoring 06/11/2023 Coronary calcium score of 0. This was <1 percentile for age-, race-, and sex-matched controls.   Mild aortic valve calcification.   Hiatal hernia.  Exercise Myoview  10/24/2020 The left ventricular ejection fraction is hyperdynamic (>65%). Nuclear stress EF: 75%. There was no ST segment deviation noted during stress. Blood pressure demonstrated a normal response to exercise. The study is normal. This is a low risk study.   Normal exercise nuclear stress test with no evidence for prior infarct or ischemia. Normal LVEF.  Normal exercise tolerance. Normal BP response to exertion.   ZIO 12/04/2019 The doinant rhythm is normal sinus with normal circadian variation. There are rare and very brief episodes of atrial tachycardia (maximum 6 beats), that do not appear to associate symptoms. There are occasional PVCs and PACs,  that show a loose correlation with symptoms (heart pounding). Many of the symptom driven recordings show normal rhythm.   Mildly abnormal arrhythmia monitor with rare episodes of atrial tachycardia and occasional premature atrial and ventricular contractions. There is a loose association between symptoms and the premature beats.  Echocardiogram 01/01/2020  1. Left ventricular ejection fraction, by estimation, is 60 to 65%. The  left ventricle has normal function. The left ventricle has no regional  wall motion abnormalities. Left ventricular diastolic parameters are  consistent with Grade I diastolic  dysfunction (impaired relaxation). GLS -20%, normal.   2. Right ventricular systolic function is normal. The right ventricular  size is normal. There is normal pulmonary artery systolic pressure. The  estimated right ventricular systolic pressure is 19.3 mmHg.   3. The mitral valve is normal in structure. Trivial mitral valve  regurgitation. No evidence of mitral stenosis.   4. The aortic valve is tricuspid. Aortic valve regurgitation is not  visualized. Mild aortic valve sclerosis is present, with no evidence of  aortic valve stenosis.   5. The inferior vena cava is normal in size with greater than 50%  respiratory variability, suggesting right atrial pressure of 3 mmHg.    Risk Assessment/Calculations:    CHA2DS2-VASc Score = 2   This indicates a 2.2% annual risk of stroke. The patient's score is based upon: CHF History: 0 HTN History: 0 Diabetes History: 0 Stroke History: 0 Vascular Disease History: 0 Age Score: 1 Gender Score: 1              Physical Exam:   VS:  BP 110/70 (BP Location: Right Arm, Patient Position: Sitting, Cuff Size: Normal)   Pulse 98   Resp 16   Ht 5' 4 (1.626 m)   Wt 154 lb (69.9 kg)   SpO2 98%   BMI 26.43 kg/m    Wt Readings from Last 3 Encounters:  06/07/24 154 lb (69.9 kg)  11/30/23 158 lb (71.7 kg)  11/17/23 157 lb 9.6 oz (71.5 kg)     GEN: Well nourished, well developed in no acute distress NECK: No JVD; No carotid bruits CARDIAC: RRR, no murmurs, rubs, gallops RESPIRATORY:  Clear to auscultation without rales, wheezing or rhonchi  ABDOMEN: Soft, non-tender, non-distended EXTREMITIES:  No edema; No acute deformity      Assessment and Plan:  Paroxysmal atrial fibrillation She had a single episode of symptomatic atrial fibrillation occurring in the setting of urosepsis in October 2018 - EKG today shows she is maintaining normal sinus rhythm - She denies any paroxysms over the past year and remains asymptomatic from a rhythm standpoint.  She denies any symptoms concerning for recurrent PAF - She does not meet criteria for anticoagulation -She is considering treatment with Evenity followed by Prolia for osteoporosis which are not biphosphonates with no clear evidence/link of increased A-fib risk. - We discussed obtaining a home rhythm monitor such as a Kardia device - We will continue to monitor for arrhythmia on a yearly basis - Continue metoprolol  succinate 12.5 mg daily  Rare atrial tachycardia Occasional PVCs/PACs Zio 12/2019 shows rare and very brief episodes of atrial tachycardia with maximum of 6 beats and occasional PVCs and PACs - She will  occasionally experience fast heart rates upon waking at night or in the early morning hours.  No palpitations - Her symptoms seem to be well-controlled - Continue metoprolol  succinate 12.5 mg  Aortic atherosclerosis Seen on CT chest 04/2023 CT cardiac scoring 07/2023 with CAC score of 0 Her most recent LDL is well-controlled at 95.  No indication for lipid-lowering therapy - Today she remains without anginal symptoms      Dispo:  Return in about 1 year (around 06/07/2025).  Signed, Lum LITTIE Louis, NP

## 2024-07-03 ENCOUNTER — Encounter: Payer: Self-pay | Admitting: Radiology

## 2024-07-26 ENCOUNTER — Ambulatory Visit: Admitting: Emergency Medicine

## 2024-07-26 ENCOUNTER — Encounter: Payer: Self-pay | Admitting: Emergency Medicine

## 2024-07-26 VITALS — BP 110/76 | HR 81 | Temp 98.2°F | Ht 64.0 in | Wt 156.6 lb

## 2024-07-26 DIAGNOSIS — R918 Other nonspecific abnormal finding of lung field: Secondary | ICD-10-CM | POA: Diagnosis not present

## 2024-07-26 NOTE — Patient Instructions (Addendum)
 We reviewed your CT scan of the chest today.  This is stable compared to priors and stable going back to 04/2023.  Good news. We will plan to repeat your CT chest in August 2026. Please follow Dr. Shelah in August to review your CT together.

## 2024-07-26 NOTE — Progress Notes (Signed)
 Subjective:    Patient ID: Veronica Woods, female    DOB: May 25, 1956, 68 y.o.   MRN: [id number]  HPI  ROV 08/12/2023 --follow-up visit for 68 year old woman with A-fib, GERD, allergic rhinitis, osteoarthritis.  She is a never smoker.  I saw her for pulmonary nodules noted on CT scan of the chest.  These were identified initially in 2018, followed with serial imaging.  The most recent scan from 04/23/2023 showed some had increased in size.  I ordered autoimmune labs and a PET scan and she returns to discuss.  PET scan from 06/14/2023 reviewed by me showed that the pulmonary nodules noted in August were stable in size and appearance without any evidence of hypermetabolism including the predominant 10 mm left lower lobe nodule.  63-month follow-up was recommended.  Labs: Rheumatoid factor, ANCA screen, ANA are all negative on 06/02/2023  ROV 07/26/2024 --68 year old woman, never smoker, whom I have followed for pulmonary nodules seen on CT scan of the chest.  She has a history of A-fib, GERD, rhinitis, osteoarthritis.  Her most recent imaging was a PET scan 06/2023 that showed stable nodules without any hypermetabolism including and a predominant 10 mm left lower lobe node.  Now she has had a CT done 06/06/2024. No respiratory sx, no cough. No flaring sx.   CT scan of the chest 06/06/2024 reviewed by me, shows stable bilateral pulmonary nodules including the index left lower lobe nodule measured now 11 mm.  She also is 8 mm left lower lobe nodule, 5 mm right lower lobe nodule, other small scattered nodules.  No new nodules.  No changes.  She has a moderate hiatal hernia.    Review of Systems As per HPI  Past Medical History:  Diagnosis Date   Allergy    Sulfa   Arthritis 2018   left foot   GERD (gastroesophageal reflux disease)    Glaucoma 2024   mainly left  use drops both eyes   Persistent atrial fibrillation (HCC) 2018   occured in the setting of urosepsis   Renal disorder    kidney  stone   Sepsis (HCC)      Family History  Problem Relation Age of Onset   Cancer Mother    Heart disease Mother    Hyperlipidemia Mother    Hypertension Mother    Stroke Mother    Cancer Father     Family History - Father had prostate cancer - Mother had melanoma   Social History   Socioeconomic History   Marital status: Married    Spouse name: Not on file   Number of children: Not on file   Years of education: Not on file   Highest education level: Bachelor's degree (e.g., BA, AB, BS)  Occupational History   Not on file  Tobacco Use   Smoking status: Never    Passive exposure: Never   Smokeless tobacco: Never  Vaping Use   Vaping status: Never Used  Substance and Sexual Activity   Alcohol use: No   Drug use: No   Sexual activity: Yes    Birth control/protection: Post-menopausal  Other Topics Concern   Not on file  Social History Narrative   Not on file   Social Drivers of Health   Financial Resource Strain: Low Risk  (11/29/2023)   Overall Financial Resource Strain (CARDIA)    Difficulty of Paying Living Expenses: Not hard at all  Food Insecurity: No Food Insecurity (11/29/2023)   Hunger Vital Sign  Worried About Programme Researcher, Broadcasting/film/video in the Last Year: Never true    Ran Out of Food in the Last Year: Never true  Transportation Needs: No Transportation Needs (11/29/2023)   PRAPARE - Administrator, Civil Service (Medical): No    Lack of Transportation (Non-Medical): No  Physical Activity: Sufficiently Active (11/29/2023)   Exercise Vital Sign    Days of Exercise per Week: 3 days    Minutes of Exercise per Session: 50 min  Recent Concern: Physical Activity - Insufficiently Active (11/17/2023)   Exercise Vital Sign    Days of Exercise per Week: 4 days    Minutes of Exercise per Session: 30 min  Stress: No Stress Concern Present (11/29/2023)   [employer] of Occupational Health - Occupational Stress Questionnaire    Feeling of Stress : Not at  all  Social Connections: Socially Integrated (11/29/2023)   Social Connection and Isolation Panel    Frequency of Communication with Friends and Family: Twice a week    Frequency of Social Gatherings with Friends and Family: More than three times a week    Attends Religious Services: More than 4 times per year    Active Member of [employer] or Organizations: Yes    Attends Banker Meetings: More than 4 times per year    Marital Status: Married  Catering Manager Violence: Not At Risk (11/17/2023)   Humiliation, Afraid, Rape, and Kick questionnaire    Fear of Current or Ex-Partner: No    Emotionally Abused: No    Physically Abused: No    Sexually Abused: No    She has lived locally all of her life  Has worked as a runner, 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   Social History - Never smoker  Allergies  Allergen Reactions   Elemental Sulfur Anaphylaxis and Other (See Comments)    Childhood reaction     Outpatient Medications Prior to Visit  Medication Sig Dispense Refill   metoprolol  succinate (TOPROL -XL) 25 MG 24 hr tablet Take 0.5 tablets (12.5 mg total) by mouth daily. 45 tablet 3   Netarsudil-Latanoprost (ROCKLATAN) 0.02-0.005 % SOLN Place 0.02 drops into both eyes 1 day or 1 dose.     No facility-administered medications prior to visit.         Objective:   Physical Exam Vitals:   07/26/24 0926  BP: 110/76  Pulse: 81  Temp: 98.2 F (36.8 C)  SpO2: 97%  Weight: 156 lb 9.6 oz (71 kg)  Height: 5' 4 (1.626 m)    Gen: Pleasant, well-nourished, in no distress,  normal affect  ENT: No lesions,  mouth clear,  oropharynx clear, no postnasal drip  Neck: No JVD, no stridor  Lungs: No use of accessory muscles, no crackles or wheezing on normal respiration, no wheeze on forced expiration  Cardiovascular: RRR, heart sounds normal, no murmur or gallops, no peripheral edema  Musculoskeletal: No deformities, no cyanosis or clubbing  Neuro: alert, awake, non  focal  Skin: Warm, no lesions or rash       Assessment & Plan:   Pulmonary nodules We now have over 1 year of stability going back to 04/2023.  In retrospect her nodules may be more prominent, larger than when they were seen on her CT scan of the abdomen in 2018.  I like to follow in August 2026 which would establish 2 years of stability.  If unchanged and we can talk about whether we need to continue to follow.  We  may decide to follow for an additional year given the comparison to the 2018 study.     Lamar Chris, MD, PhD 07/26/2024, 10:04 AM [facility] [phone] or if no answer before 7:00PM call [phone] For any issues after 7:00PM please call eLink [phone]

## 2024-07-26 NOTE — Assessment & Plan Note (Signed)
 We now have over 1 year of stability going back to 04/2023.  In retrospect her nodules may be more prominent, larger than when they were seen on her CT scan of the abdomen in 2018.  I like to follow in August 2026 which would establish 2 years of stability.  If unchanged and we can talk about whether we need to continue to follow.  We may decide to follow for an additional year given the comparison to the 2018 study.

## 2024-09-11 ENCOUNTER — Other Ambulatory Visit: Payer: Self-pay | Admitting: Radiology

## 2024-09-11 DIAGNOSIS — M810 Age-related osteoporosis without current pathological fracture: Secondary | ICD-10-CM

## 2024-09-11 MED ORDER — ROMOSOZUMAB-AQQG 105 MG/1.17ML ~~LOC~~ SOSY: 210.0000 mg | PREFILLED_SYRINGE | Freq: Once | SUBCUTANEOUS | Status: AC

## 2024-09-18 ENCOUNTER — Telehealth: Payer: Self-pay | Admitting: Physician Assistant

## 2024-09-18 NOTE — Telephone Encounter (Signed)
 Pt called stating she is returning a call from Wendy May. Pt asked for a call at [phone].

## 2024-12-04 ENCOUNTER — Encounter: Admitting: Emergency Medicine

## 2024-12-04 ENCOUNTER — Ambulatory Visit

## 2025-04-10 ENCOUNTER — Other Ambulatory Visit
